# Patient Record
Sex: Female | Born: 1980 | Race: Black or African American | Hispanic: No | Marital: Single | Smoking: Former smoker
Health system: Southern US, Community
[De-identification: ages and names within clinical notes are randomized; demographics above are authoritative.]

## PROBLEM LIST (undated history)

## (undated) ENCOUNTER — Inpatient Hospital Stay (HOSPITAL_COMMUNITY): Payer: Self-pay

## (undated) DIAGNOSIS — IMO0002 Reserved for concepts with insufficient information to code with codable children: Secondary | ICD-10-CM

## (undated) DIAGNOSIS — J302 Other seasonal allergic rhinitis: Secondary | ICD-10-CM

## (undated) DIAGNOSIS — R87619 Unspecified abnormal cytological findings in specimens from cervix uteri: Secondary | ICD-10-CM

## (undated) DIAGNOSIS — I10 Essential (primary) hypertension: Secondary | ICD-10-CM

## (undated) HISTORY — PX: HERNIA REPAIR: SHX51

## (undated) HISTORY — DX: Other seasonal allergic rhinitis: J30.2

## (undated) HISTORY — PX: COLPOSCOPY: SHX161

---
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
## 2002-09-16 ENCOUNTER — Emergency Department (HOSPITAL_COMMUNITY): Admission: EM | Admit: 2002-09-16 | Discharge: 2002-09-16 | Payer: Self-pay | Admitting: Emergency Medicine

## 2002-09-19 ENCOUNTER — Emergency Department (HOSPITAL_COMMUNITY): Admission: AD | Admit: 2002-09-19 | Discharge: 2002-09-19 | Payer: Self-pay | Admitting: Emergency Medicine

## 2003-10-11 ENCOUNTER — Emergency Department (HOSPITAL_COMMUNITY): Admission: EM | Admit: 2003-10-11 | Discharge: 2003-10-11 | Payer: Self-pay | Admitting: Family Medicine

## 2006-02-25 ENCOUNTER — Encounter (INDEPENDENT_AMBULATORY_CARE_PROVIDER_SITE_OTHER): Payer: Self-pay | Admitting: Family Medicine

## 2006-06-13 ENCOUNTER — Ambulatory Visit: Payer: Self-pay | Admitting: Obstetrics & Gynecology

## 2006-08-22 ENCOUNTER — Ambulatory Visit: Payer: Self-pay | Admitting: Gynecology

## 2006-08-22 ENCOUNTER — Encounter (INDEPENDENT_AMBULATORY_CARE_PROVIDER_SITE_OTHER): Payer: Self-pay | Admitting: Gynecology

## 2006-09-08 ENCOUNTER — Emergency Department (HOSPITAL_COMMUNITY): Admission: EM | Admit: 2006-09-08 | Discharge: 2006-09-08 | Payer: Self-pay | Admitting: Emergency Medicine

## 2006-09-12 ENCOUNTER — Ambulatory Visit: Payer: Self-pay | Admitting: Obstetrics and Gynecology

## 2007-03-24 ENCOUNTER — Ambulatory Visit: Payer: Self-pay | Admitting: Family Medicine

## 2007-03-24 ENCOUNTER — Encounter (INDEPENDENT_AMBULATORY_CARE_PROVIDER_SITE_OTHER): Payer: Self-pay | Admitting: Nurse Practitioner

## 2007-03-24 LAB — CONVERTED CEMR LAB
ALT: 10 units/L (ref 0–35)
AST: 19 units/L (ref 0–37)
Albumin: 4.4 g/dL (ref 3.5–5.2)
Alkaline Phosphatase: 51 units/L (ref 39–117)
BUN: 13 mg/dL (ref 6–23)
Basophils Absolute: 0 10*3/uL (ref 0.0–0.1)
Basophils Relative: 0 % (ref 0–1)
CO2: 20 meq/L (ref 19–32)
Calcium: 9.1 mg/dL (ref 8.4–10.5)
Chloride: 109 meq/L (ref 96–112)
Creatinine, Ser: 0.74 mg/dL (ref 0.40–1.20)
Eosinophils Absolute: 0 10*3/uL (ref 0.0–0.7)
Eosinophils Relative: 1 % (ref 0–5)
Glucose, Bld: 70 mg/dL (ref 70–99)
HCT: 38.5 % (ref 36.0–46.0)
Hemoglobin: 12.7 g/dL (ref 12.0–15.0)
Lymphocytes Relative: 39 % (ref 12–46)
Lymphs Abs: 2 10*3/uL (ref 0.7–4.0)
MCHC: 33 g/dL (ref 30.0–36.0)
MCV: 93.2 fL (ref 78.0–100.0)
Monocytes Absolute: 0.4 10*3/uL (ref 0.1–1.0)
Monocytes Relative: 7 % (ref 3–12)
Neutro Abs: 2.7 10*3/uL (ref 1.7–7.7)
Neutrophils Relative %: 53 % (ref 43–77)
Platelets: 164 10*3/uL (ref 150–400)
Potassium: 4.1 meq/L (ref 3.5–5.3)
RBC: 4.13 M/uL (ref 3.87–5.11)
RDW: 13.1 % (ref 11.5–15.5)
Sodium: 142 meq/L (ref 135–145)
TSH: 0.853 microintl units/mL (ref 0.350–5.50)
Total Bilirubin: 0.5 mg/dL (ref 0.3–1.2)
Total Protein: 7.2 g/dL (ref 6.0–8.3)
WBC: 5.1 10*3/uL (ref 4.0–10.5)

## 2007-03-25 ENCOUNTER — Ambulatory Visit: Payer: Self-pay | Admitting: *Deleted

## 2007-04-10 ENCOUNTER — Ambulatory Visit: Payer: Self-pay | Admitting: Family Medicine

## 2007-06-02 ENCOUNTER — Encounter (INDEPENDENT_AMBULATORY_CARE_PROVIDER_SITE_OTHER): Payer: Self-pay | Admitting: Nurse Practitioner

## 2007-06-02 ENCOUNTER — Ambulatory Visit: Payer: Self-pay | Admitting: Family Medicine

## 2007-06-02 LAB — CONVERTED CEMR LAB
Cholesterol: 183 mg/dL (ref 0–200)
HDL: 62 mg/dL (ref >39)
LDL Cholesterol: 111 mg/dL — ABNORMAL HIGH (ref 0–99)
Total CHOL/HDL Ratio: 3
Triglycerides: 50 mg/dL (ref <150)
VLDL: 10 mg/dL (ref 0–40)

## 2007-07-15 ENCOUNTER — Ambulatory Visit: Payer: Self-pay | Admitting: Family Medicine

## 2007-08-07 ENCOUNTER — Ambulatory Visit: Payer: Self-pay | Admitting: Family Medicine

## 2007-11-05 ENCOUNTER — Ambulatory Visit: Payer: Self-pay | Admitting: Internal Medicine

## 2007-11-05 ENCOUNTER — Encounter (INDEPENDENT_AMBULATORY_CARE_PROVIDER_SITE_OTHER): Payer: Self-pay | Admitting: Family Medicine

## 2007-11-05 ENCOUNTER — Other Ambulatory Visit: Admission: RE | Admit: 2007-11-05 | Discharge: 2007-11-05 | Payer: Self-pay | Admitting: Family Medicine

## 2007-11-24 ENCOUNTER — Ambulatory Visit: Payer: Self-pay | Admitting: Internal Medicine

## 2008-02-17 ENCOUNTER — Ambulatory Visit: Payer: Self-pay | Admitting: Internal Medicine

## 2009-02-13 ENCOUNTER — Emergency Department (HOSPITAL_COMMUNITY): Admission: EM | Admit: 2009-02-13 | Discharge: 2009-02-13 | Payer: Self-pay | Admitting: Emergency Medicine

## 2009-07-04 ENCOUNTER — Emergency Department (HOSPITAL_COMMUNITY): Admission: EM | Admit: 2009-07-04 | Discharge: 2009-07-04 | Payer: Self-pay | Admitting: Family Medicine

## 2009-07-10 ENCOUNTER — Encounter: Admission: RE | Admit: 2009-07-10 | Discharge: 2009-07-10 | Payer: Self-pay | Admitting: General Surgery

## 2009-08-10 ENCOUNTER — Ambulatory Visit (HOSPITAL_COMMUNITY): Admission: RE | Admit: 2009-08-10 | Discharge: 2009-08-10 | Payer: Self-pay | Admitting: General Surgery

## 2010-02-27 NOTE — Letter (Signed)
Summary: Eligibility Information  Eligibility Information   Imported By: Abby Potash 10/17/2006 11:47:08  _____________________________________________________________________  External Attachment:    Type:   Image     Comment:   External Document

## 2010-04-15 LAB — SURGICAL PCR SCREEN
MRSA, PCR: NEGATIVE
Staphylococcus aureus: NEGATIVE

## 2010-04-15 LAB — DIFFERENTIAL
Basophils Absolute: 0 10*3/uL (ref 0.0–0.1)
Basophils Relative: 1 % (ref 0–1)
Eosinophils Absolute: 0 10*3/uL (ref 0.0–0.7)
Eosinophils Relative: 1 % (ref 0–5)
Lymphocytes Relative: 34 % (ref 12–46)
Lymphs Abs: 1.8 10*3/uL (ref 0.7–4.0)
Monocytes Absolute: 0.4 10*3/uL (ref 0.1–1.0)
Monocytes Relative: 7 % (ref 3–12)
Neutro Abs: 3.1 10*3/uL (ref 1.7–7.7)
Neutrophils Relative %: 59 % (ref 43–77)

## 2010-04-15 LAB — BASIC METABOLIC PANEL
BUN: 8 mg/dL (ref 6–23)
CO2: 25 mEq/L (ref 19–32)
Calcium: 9.2 mg/dL (ref 8.4–10.5)
Chloride: 107 mEq/L (ref 96–112)
Creatinine, Ser: 0.71 mg/dL (ref 0.4–1.2)
GFR calc Af Amer: >60 mL/min (ref >60)
GFR calc non Af Amer: >60 mL/min (ref >60)
Glucose, Bld: 84 mg/dL (ref 70–99)
Potassium: 4.5 mEq/L (ref 3.5–5.1)
Sodium: 137 mEq/L (ref 135–145)

## 2010-04-15 LAB — CBC
HCT: 37.5 % (ref 36.0–46.0)
Hemoglobin: 13 g/dL (ref 12.0–15.0)
MCH: 32.9 pg (ref 26.0–34.0)
MCHC: 34.5 g/dL (ref 30.0–36.0)
MCV: 95.3 fL (ref 78.0–100.0)
Platelets: 127 10*3/uL — ABNORMAL LOW (ref 150–400)
RBC: 3.94 MIL/uL (ref 3.87–5.11)
RDW: 12.6 % (ref 11.5–15.5)
WBC: 5.2 10*3/uL (ref 4.0–10.5)

## 2010-04-15 LAB — HCG, SERUM, QUALITATIVE: Preg, Serum: NEGATIVE

## 2010-04-16 LAB — URINALYSIS, ROUTINE W REFLEX MICROSCOPIC
Bilirubin Urine: NEGATIVE
Glucose, UA: NEGATIVE mg/dL
Ketones, ur: NEGATIVE mg/dL
Leukocytes, UA: NEGATIVE
Nitrite: NEGATIVE
Protein, ur: NEGATIVE mg/dL
Specific Gravity, Urine: 1.023 (ref 1.005–1.030)
Urobilinogen, UA: 1 mg/dL (ref 0.0–1.0)
pH: 6.5 (ref 5.0–8.0)

## 2010-04-16 LAB — URINE MICROSCOPIC-ADD ON

## 2010-04-16 LAB — GC/CHLAMYDIA PROBE AMP, GENITAL
Chlamydia, DNA Probe: NEGATIVE
Chlamydia, DNA Probe: NEGATIVE
GC Probe Amp, Genital: NEGATIVE
GC Probe Amp, Genital: NEGATIVE

## 2010-04-16 LAB — WET PREP, GENITAL
Clue Cells Wet Prep HPF POC: NONE SEEN
Clue Cells Wet Prep HPF POC: NONE SEEN
Trich, Wet Prep: NONE SEEN
Trich, Wet Prep: NONE SEEN
WBC, Wet Prep HPF POC: NONE SEEN
Yeast Wet Prep HPF POC: NONE SEEN
Yeast Wet Prep HPF POC: NONE SEEN

## 2010-04-16 LAB — POCT I-STAT, CHEM 8
BUN: 12 mg/dL (ref 6–23)
Calcium, Ion: 1.1 mmol/L — ABNORMAL LOW (ref 1.12–1.32)
Chloride: 105 mEq/L (ref 96–112)
Creatinine, Ser: 0.7 mg/dL (ref 0.4–1.2)
Glucose, Bld: 85 mg/dL (ref 70–99)
HCT: 42 % (ref 36.0–46.0)
Hemoglobin: 14.3 g/dL (ref 12.0–15.0)
Potassium: 3.6 mEq/L (ref 3.5–5.1)
Sodium: 136 mEq/L (ref 135–145)
TCO2: 24 mmol/L (ref 0–100)

## 2010-04-16 LAB — POCT PREGNANCY, URINE: Preg Test, Ur: NEGATIVE

## 2010-06-12 NOTE — Group Therapy Note (Signed)
NAME:  Vidrio, [place]                 ACCOUNT NO.:  [id number]   MEDICAL RECORD NO.:  [id number]          PATIENT TYPE:  WOC   LOCATION:  [facility]                   FACILITY:  [facility]   PHYSICIAN:  Ginger Carne, MD DATE OF BIRTH:  Oct 21, 1980   DATE OF SERVICE:                                  CLINIC NOTE   This patient is a 30 year old African American female who had an  abnormal Pap smear at [facility] dated March 2008, demonstrating  high-grade squamous intraepithelial lesion, encompassing CIS moderate  and severe dysplasia.  Subsequent to that the patient had a colposcopy  performed at [facility], it was reported to be satisfactory with  several areas possibly consistent with a nonspecific lesion.  Biopsy  results indicated squamous intraepithelial lesion, but this could not be  graded due to the partially denuded tissue and therefore a definitive  diagnosis was not made by the pathologist.  The statement was highly  concerning for high-grade squamous lesion but grading could not be  obtained.  In lieu of the patient's age, it was determined the safest  course of action at this time was to repeat a Pap smear.  If it comes  back abnormal it would be preferable to have the patient be colposcoped  here at our office to reevaluate because of her abnormality.  The  patient understands same, will return in 2 weeks after this Pap smear to  discuss said findings.           ______________________________  Ginger Carne, MD     SHB/MEDQ  D:  08/22/2006  T:  08/22/2006  Job:  [id number]

## 2010-08-28 ENCOUNTER — Other Ambulatory Visit (HOSPITAL_COMMUNITY): Payer: Self-pay | Admitting: Family Medicine

## 2010-08-28 DIAGNOSIS — R519 Headache, unspecified: Secondary | ICD-10-CM

## 2010-08-30 ENCOUNTER — Ambulatory Visit (HOSPITAL_COMMUNITY)
Admission: RE | Admit: 2010-08-30 | Discharge: 2010-08-30 | Payer: Self-pay | Source: Ambulatory Visit | Attending: Family Medicine | Admitting: Family Medicine

## 2010-09-12 ENCOUNTER — Ambulatory Visit (HOSPITAL_COMMUNITY): Payer: Self-pay

## 2010-09-17 ENCOUNTER — Other Ambulatory Visit (HOSPITAL_COMMUNITY): Payer: Self-pay | Admitting: Family Medicine

## 2010-09-17 DIAGNOSIS — R519 Headache, unspecified: Secondary | ICD-10-CM

## 2010-09-18 ENCOUNTER — Other Ambulatory Visit (HOSPITAL_COMMUNITY): Payer: Self-pay | Admitting: Family Medicine

## 2010-09-18 ENCOUNTER — Ambulatory Visit (HOSPITAL_COMMUNITY)
Admission: RE | Admit: 2010-09-18 | Discharge: 2010-09-18 | Disposition: A | Discharge status: Home / Self Care | Payer: Self-pay | Source: Ambulatory Visit | Attending: Family Medicine | Admitting: Family Medicine

## 2010-09-18 DIAGNOSIS — R519 Headache, unspecified: Secondary | ICD-10-CM

## 2010-09-18 DIAGNOSIS — R51 Headache: Secondary | ICD-10-CM | POA: Insufficient documentation

## 2011-01-29 NOTE — L&D Delivery Note (Signed)
Delivery Note At 2:24 PM a viable female was delivered via Vaginal, Spontaneous Delivery (Presentation: Middle Occiput Anterior).  APGAR: 8, 9; weight .   Placenta status: Intact, Spontaneous.  Cord: 3 vessels with the following complications: None.  Cord pH: not done  Anesthesia: Epidural  Episiotomy: None Lacerations: 2nd degree Suture Repair: 2.0 vicryl Est. Blood Loss (mL):   Mom to postpartum.  Baby to nursery-stable.  Rhonda Fisher A 12/17/2011, 2:35 PM

## 2011-04-16 ENCOUNTER — Emergency Department (HOSPITAL_COMMUNITY)
Admission: EM | Admit: 2011-04-16 | Discharge: 2011-04-17 | Disposition: A | Discharge status: Home / Self Care | Payer: Medicaid Other | Attending: Emergency Medicine | Admitting: Emergency Medicine

## 2011-04-16 ENCOUNTER — Encounter (HOSPITAL_COMMUNITY): Payer: Self-pay | Admitting: Emergency Medicine

## 2011-04-16 DIAGNOSIS — Z9889 Other specified postprocedural states: Secondary | ICD-10-CM | POA: Insufficient documentation

## 2011-04-16 DIAGNOSIS — Z331 Pregnant state, incidental: Secondary | ICD-10-CM | POA: Insufficient documentation

## 2011-04-16 DIAGNOSIS — F17200 Nicotine dependence, unspecified, uncomplicated: Secondary | ICD-10-CM | POA: Insufficient documentation

## 2011-04-16 DIAGNOSIS — R109 Unspecified abdominal pain: Secondary | ICD-10-CM | POA: Insufficient documentation

## 2011-04-16 LAB — URINE MICROSCOPIC-ADD ON

## 2011-04-16 LAB — URINALYSIS, ROUTINE W REFLEX MICROSCOPIC
Bilirubin Urine: NEGATIVE
Glucose, UA: NEGATIVE mg/dL
Hgb urine dipstick: NEGATIVE
Ketones, ur: NEGATIVE mg/dL
Nitrite: NEGATIVE
Protein, ur: NEGATIVE mg/dL
Specific Gravity, Urine: 1.023 (ref 1.005–1.030)
Urobilinogen, UA: 0.2 mg/dL (ref 0.0–1.0)
pH: 5.5 (ref 5.0–8.0)

## 2011-04-16 LAB — POCT PREGNANCY, URINE: Preg Test, Ur: POSITIVE — AB

## 2011-04-16 NOTE — ED Notes (Signed)
Pt c/o lower abd cramping off and on x's 2 weeks. Last BM 2 days ago.  Denies nausea or vomiting, No vag. discharge

## 2011-04-17 ENCOUNTER — Emergency Department (HOSPITAL_COMMUNITY): Payer: Medicaid Other

## 2011-04-17 LAB — DIFFERENTIAL
Basophils Absolute: 0 10*3/uL (ref 0.0–0.1)
Basophils Relative: 0 % (ref 0–1)
Eosinophils Absolute: 0.1 10*3/uL (ref 0.0–0.7)
Eosinophils Relative: 1 % (ref 0–5)
Lymphocytes Relative: 38 % (ref 12–46)
Lymphs Abs: 2.8 10*3/uL (ref 0.7–4.0)
Monocytes Absolute: 1 10*3/uL (ref 0.1–1.0)
Monocytes Relative: 13 % — ABNORMAL HIGH (ref 3–12)
Neutro Abs: 3.5 10*3/uL (ref 1.7–7.7)
Neutrophils Relative %: 48 % (ref 43–77)

## 2011-04-17 LAB — CBC
HCT: 36.7 % (ref 36.0–46.0)
Hemoglobin: 13 g/dL (ref 12.0–15.0)
MCH: 31.6 pg (ref 26.0–34.0)
MCHC: 35.4 g/dL (ref 30.0–36.0)
MCV: 89.1 fL (ref 78.0–100.0)
Platelets: 159 10*3/uL (ref 150–400)
RBC: 4.12 MIL/uL (ref 3.87–5.11)
RDW: 12.1 % (ref 11.5–15.5)
WBC: 7.3 10*3/uL (ref 4.0–10.5)

## 2011-04-17 LAB — HCG, QUANTITATIVE, PREGNANCY: hCG, Beta Chain, Quant, S: 6714 m[IU]/mL — ABNORMAL HIGH (ref <5)

## 2011-04-17 NOTE — ED Provider Notes (Signed)
History     CSN: [id number]  Arrival date & time 04/16/11  2011   First MD Initiated Contact with Patient 04/17/11 0056      Chief Complaint  Patient presents with  . Abdominal Pain    (Consider location/radiation/quality/duration/timing/severity/associated sxs/prior treatment) HPI Comments: Last period only lasted 2 days.  This was last month.  Patient is a 31 y.o. female presenting with abdominal pain. The history is provided by the patient.  Abdominal Pain The primary symptoms of the illness include abdominal pain. The primary symptoms of the illness do not include fever, nausea, vomiting, dysuria or vaginal discharge. Episode onset: 2 weeks. The onset of the illness was gradual. The problem has been gradually worsening.  Pregnant Now: possibly. The patient has not had a change in bowel habit. Symptoms associated with the illness do not include chills, urgency, frequency or back pain.    History reviewed. No pertinent past medical history.  Past Surgical History  Procedure Date  . Hernia repair     No family history on file.  History  Substance Use Topics  . Smoking status: Current Everyday Smoker  . Smokeless tobacco: Not on file  . Alcohol Use: Yes     ocassional    OB History    Grav Para Term Preterm Abortions TAB SAB Ect Mult Living                  Review of Systems  Constitutional: Negative for fever and chills.  Gastrointestinal: Positive for abdominal pain. Negative for nausea and vomiting.  Genitourinary: Negative for dysuria, urgency, frequency and vaginal discharge.  Musculoskeletal: Negative for back pain.  All other systems reviewed and are negative.    Allergies  Review of patient's allergies indicates no known allergies.  Home Medications   Current Outpatient Rx  Name Route Sig Dispense Refill  . IBUPROFEN 200 MG PO TABS Oral Take 400 mg by mouth every 6 (six) hours as needed. For pain      BP 135/86  Pulse 98  Temp(Src) 97.7 F  (36.5 C) (Oral)  Resp 16  SpO2 100%  LMP 03/13/2011  Physical Exam  Nursing note and vitals reviewed. Constitutional: She is oriented to person, place, and time. She appears well-developed and well-nourished. No distress.  HENT:  Head: Normocephalic and atraumatic.  Neck: Normal range of motion. Neck supple.  Cardiovascular: Normal rate and regular rhythm.  Exam reveals no gallop and no friction rub.   No murmur heard. Pulmonary/Chest: Effort normal and breath sounds normal. No respiratory distress. She has no wheezes.  Abdominal: Soft. Bowel sounds are normal. She exhibits no distension. There is no tenderness.  Musculoskeletal: Normal range of motion.  Neurological: She is alert and oriented to person, place, and time.  Skin: Skin is warm and dry. She is not diaphoretic.    ED Course  Procedures (including critical care time)  Labs Reviewed  URINALYSIS, ROUTINE W REFLEX MICROSCOPIC - Abnormal; Notable for the following:    Leukocytes, UA SMALL (*)    All other components within normal limits  POCT PREGNANCY, URINE - Abnormal; Notable for the following:    Preg Test, Ur POSITIVE (*)    All other components within normal limits  URINE MICROSCOPIC-ADD ON   No results found.   No diagnosis found.    MDM  She presents with lower abd pain, cramping for the past two days.  Her last period was short.  Pregnancy test here was positive and serum Bhcg  was 6700.  Ultrasound was performed showing an iup.  No ectopic.  Will discharge to home with follow up with OB.        Geoffery Lyons, MD 04/18/11 1606

## 2011-04-17 NOTE — Discharge Instructions (Signed)
Abdominal Pain During Pregnancy  Abdominal discomfort is common in pregnancy. Most of the time, it does not cause harm. There are many causes of abdominal pain. Some causes are more serious than others. Some of the causes of abdominal pain in pregnancy are easily diagnosed. Occasionally, the diagnosis takes time to understand. Other times, the cause is not determined. Abdominal pain can be a sign that something is very wrong with the pregnancy, or the pain may have nothing to do with the pregnancy at all. For this reason, always tell your caregiver if you have any abdominal discomfort.  CAUSES  Common and harmless causes of abdominal pain include:   Constipation.   Excess gas and bloating.   Round ligament pain. This is pain that is felt in the folds of the groin.   The position the baby or placenta is in.   Baby kicks.   Braxton-Hicks contractions. These are mild contractions that do not cause cervical dilation.  Serious causes of abdominal pain include:   Ectopic pregnancy. This happens when a fertilized egg implants outside of the uterus.   Miscarriage.   Preterm labor. This is when labor starts at less than 37 weeks of pregnancy.   Placental abruption. This is when the placenta partially or completely separates from the uterus.   Preeclampsia. This is often associated with high blood pressure and has been referred to as "toxemia in pregnancy."   Uterine or amniotic fluid infections.  Causes unrelated to pregnancy include:   Urinary tract infection.   Gallbladder stones or inflammation.   Hepatitis or other liver illness.   Intestinal problems, stomach flu, food poisoning, or ulcer.   Appendicitis.   Kidney (renal) stones.   Kidney infection (pylonephritis).  HOME CARE INSTRUCTIONS   For mild pain:   Do not have sexual intercourse or put anything in your vagina until your symptoms go away completely.   Get plenty of rest until your pain improves. If your pain does not improve in 1 hour, call  your caregiver.   Drink clear fluids if you feel nauseous. Avoid solid food as long as you are uncomfortable or nauseous.   Only take medicine as directed by your caregiver.   Keep all follow-up appointments with your caregiver.  SEEK IMMEDIATE MEDICAL CARE IF:   You are bleeding, leaking fluid, or passing tissue from the vagina.   You have increasing pain or cramping.   You have persistent vomiting.   You have painful or bloody urination.   You have a fever.   You notice a decrease in your baby's movements.   You have extreme weakness or feel faint.   You have shortness of breath, with or without abdominal pain.   You develop a severe headache with abdominal pain.   You have abnormal vaginal discharge with abdominal pain.   You have persistent diarrhea.   You have abdominal pain that continues even after rest, or gets worse.  MAKE SURE YOU:    Understand these instructions.   Will watch your condition.   Will get help right away if you are not doing well or get worse.  Document Released: 01/14/2005 Document Revised: 01/03/2011 Document Reviewed: 08/10/2010  [employer] Patient Information 2012 [employer].

## 2011-04-21 ENCOUNTER — Encounter (HOSPITAL_COMMUNITY): Payer: Self-pay | Admitting: Advanced Practice Midwife

## 2011-04-21 ENCOUNTER — Inpatient Hospital Stay (HOSPITAL_COMMUNITY)
Admission: AD | Admit: 2011-04-21 | Discharge: 2011-04-21 | Disposition: A | Discharge status: Home / Self Care | Payer: Medicaid Other | Source: Ambulatory Visit | Attending: Obstetrics & Gynecology | Admitting: Obstetrics & Gynecology

## 2011-04-21 DIAGNOSIS — Z3490 Encounter for supervision of normal pregnancy, unspecified, unspecified trimester: Secondary | ICD-10-CM

## 2011-04-21 DIAGNOSIS — R109 Unspecified abdominal pain: Secondary | ICD-10-CM | POA: Insufficient documentation

## 2011-04-21 DIAGNOSIS — Z331 Pregnant state, incidental: Secondary | ICD-10-CM

## 2011-04-21 DIAGNOSIS — R87613 High grade squamous intraepithelial lesion on cytologic smear of cervix (HGSIL): Secondary | ICD-10-CM

## 2011-04-21 DIAGNOSIS — Z3400 Encounter for supervision of normal first pregnancy, unspecified trimester: Secondary | ICD-10-CM

## 2011-04-21 DIAGNOSIS — K5900 Constipation, unspecified: Secondary | ICD-10-CM | POA: Insufficient documentation

## 2011-04-21 DIAGNOSIS — O99891 Other specified diseases and conditions complicating pregnancy: Secondary | ICD-10-CM | POA: Insufficient documentation

## 2011-04-21 DIAGNOSIS — Z9889 Other specified postprocedural states: Secondary | ICD-10-CM

## 2011-04-21 DIAGNOSIS — Z8719 Personal history of other diseases of the digestive system: Secondary | ICD-10-CM

## 2011-04-21 HISTORY — DX: Unspecified abnormal cytological findings in specimens from cervix uteri: R87.619

## 2011-04-21 HISTORY — DX: Essential (primary) hypertension: I10

## 2011-04-21 HISTORY — DX: Reserved for concepts with insufficient information to code with codable children: IMO0002

## 2011-04-21 LAB — URINE MICROSCOPIC-ADD ON

## 2011-04-21 LAB — URINALYSIS, ROUTINE W REFLEX MICROSCOPIC
Bilirubin Urine: NEGATIVE
Glucose, UA: NEGATIVE mg/dL
Hgb urine dipstick: NEGATIVE
Ketones, ur: NEGATIVE mg/dL
Nitrite: NEGATIVE
Protein, ur: NEGATIVE mg/dL
Specific Gravity, Urine: 1.025 (ref 1.005–1.030)
Urobilinogen, UA: 0.2 mg/dL (ref 0.0–1.0)
pH: 6 (ref 5.0–8.0)

## 2011-04-21 LAB — WET PREP, GENITAL
Clue Cells Wet Prep HPF POC: NONE SEEN
Trich, Wet Prep: NONE SEEN
Yeast Wet Prep HPF POC: NONE SEEN

## 2011-04-21 MED ORDER — PRENATAL RX 60-1 MG PO TABS: 1.0000 | ORAL_TABLET | Freq: Every day | ORAL | Status: DC

## 2011-04-21 NOTE — MAU Provider Note (Signed)
History     CSN: [id number]  Arrival date and time: 04/21/11 2031   First Provider Initiated Contact with Patient 04/21/11 2237      Chief Complaint  Patient presents with  . Abdominal Pain   HPI This is a 31 y.o. at [redacted]w[redacted]d who presents with persistent Cramping.  Was seen at the other ER about 4 days ago for the same.  Had a quant and [place] which showed and IUGS with yolk sac.  No bleeding. No fever. States it is her first pregnancy and she wants to be sure everything is OK.    OB History    Grav Para Term Preterm Abortions TAB SAB Ect Mult Living   1 0 0 0 0 0 0 0 0 0       Past Medical History  Diagnosis Date  . Hypertension     currently no meds  . Abnormal Pap smear     colpo wnl    Past Surgical History  Procedure Date  . Hernia repair   . Colposcopy     Family History  Problem Relation Age of Onset  . Anesthesia problems Neg Hx     History  Substance Use Topics  . Smoking status: Current Everyday Smoker -- 0.2 packs/day    Types: Cigarettes  . Smokeless tobacco: Not on file  . Alcohol Use: No     ocassional    Allergies: No Known Allergies  Prescriptions prior to admission  Medication Sig Dispense Refill  . Multiple Vitamin (MULITIVITAMIN WITH MINERALS) TABS Take 1 tablet by mouth every morning.      Marland Kitchen ibuprofen (ADVIL,MOTRIN) 200 MG tablet Take 400 mg by mouth every 6 (six) hours as needed. For pain        Review of Systems  Constitutional: Negative for fever, chills and malaise/fatigue.  Cardiovascular: Negative for chest pain.  Gastrointestinal: Positive for abdominal pain (uterine cramps) and constipation. Negative for heartburn, nausea, vomiting and diarrhea.  Genitourinary: Negative for dysuria.       No bleeding    Physical Exam   Blood pressure 120/69, pulse 93, temperature 97.4 F (36.3 C), resp. rate 16, height 4' 11.5" (1.511 m), weight 117 lb (53.071 kg), last menstrual period 03/13/2011, SpO2 100.00%.  Physical Exam    Constitutional: She is oriented to person, place, and time. She appears well-developed and well-nourished. No distress.  HENT:  Head: Normocephalic.  Cardiovascular: Normal rate.   Respiratory: Effort normal.  GI: Soft. She exhibits no distension and no mass. There is no tenderness. There is no rebound and no guarding.  Genitourinary: Vagina normal and uterus normal. No vaginal discharge found.       Pelvic done (not done at other visit) GC/Chlam and wet prep done Uterus small, nontender Adnexae nontender  Musculoskeletal: Normal range of motion.  Neurological: She is alert and oriented to person, place, and time.  Skin: Skin is warm and dry.  Psychiatric: She has a normal mood and affect.   Results for orders placed during the hospital encounter of 04/21/11 (from the past 24 hour(s))  URINALYSIS, ROUTINE W REFLEX MICROSCOPIC     Status: Abnormal   Collection Time   04/21/11  8:58 PM      Component Value Range   Color, Urine YELLOW  YELLOW    APPearance HAZY (*) CLEAR    Specific Gravity, Urine 1.025  1.005 - 1.030    pH 6.0  5.0 - 8.0    Glucose, UA NEGATIVE  NEGATIVE (mg/dL)  Hgb urine dipstick NEGATIVE  NEGATIVE    Bilirubin Urine NEGATIVE  NEGATIVE    Ketones, ur NEGATIVE  NEGATIVE (mg/dL)   Protein, ur NEGATIVE  NEGATIVE (mg/dL)   Urobilinogen, UA 0.2  0.0 - 1.0 (mg/dL)   Nitrite NEGATIVE  NEGATIVE    Leukocytes, UA TRACE (*) NEGATIVE   URINE MICROSCOPIC-ADD ON     Status: Abnormal   Collection Time   04/21/11  8:58 PM      Component Value Range   Squamous Epithelial / LPF FEW (*) RARE    WBC, UA 0-2  <3 (WBC/hpf)   RBC / HPF 0-2  <3 (RBC/hpf)   Bacteria, UA RARE  RARE    Urine-Other MUCOUS PRESENT    WET PREP, GENITAL     Status: Abnormal   Collection Time   04/21/11 10:45 PM      Component Value Range   Yeast Wet Prep HPF POC NONE SEEN  NONE SEEN    Trich, Wet Prep NONE SEEN  NONE SEEN    Clue Cells Wet Prep HPF POC NONE SEEN  NONE SEEN    WBC, Wet Prep HPF POC  MODERATE (*) NONE SEEN     MAU Course  Procedures  MDM GC/Chlamydia, Wet Prep done Informed too early to repeat US. If develops severe pain or bleeding will repeat Pregnancy Verification letter given for Medicaid application   Assessment and Plan  A:  IUP at [redacted]w[redacted]d      No bleeding      Cramping, probably due to constipation (chronic) P:  Discussed Miralax/fiber      Start PNV      Refer to prenatal care  [facility] 04/21/2011, 10:52 PM

## 2011-04-21 NOTE — MAU Provider Note (Signed)
Attestation of Attending Supervision of Advanced Practitioner: Evaluation and management procedures were performed by the PA/NP/CNM/OB Fellow under my supervision/collaboration. Chart reviewed, and agree with management and plan.  Jaynie Collins, M.D. 04/21/2011 11:35 PM

## 2011-04-21 NOTE — Discharge Instructions (Signed)
ABCs of Pregnancy A Antepartum care is very important. Be sure you see your doctor and get prenatal care as soon as you think you are pregnant. At this time, you will be tested for infection, genetic abnormalities and potential problems with you and the pregnancy. This is the time to discuss diet, exercise, work, medications, labor, pain medication during labor and the possibility of a cesarean delivery. Ask any questions that may concern you. It is important to see your doctor regularly throughout your pregnancy. Avoid exposure to toxic substances and chemicals - such as cleaning solvents, lead and mercury, some insecticides, and paint. Pregnant women should avoid exposure to paint fumes, and fumes that cause you to feel ill, dizzy or faint. When possible, it is a good idea to have a pre-pregnancy consultation with your caregiver to begin some important recommendations your caregiver suggests such as, taking folic acid, exercising, quitting smoking, avoiding alcoholic beverages, etc. B Breastfeeding is the healthiest choice for both you and your baby. It has many nutritional benefits for the baby and health benefits for the mother. It also creates a very tight and loving bond between the baby and mother. Talk to your doctor, your family and friends, and your employer about how you choose to feed your baby and how they can support you in your decision. Not all birth defects can be prevented, but a woman can take actions that may increase her chance of having a healthy baby. Many birth defects happen very early in pregnancy, sometimes before a woman even knows she is pregnant. Birth defects or abnormalities of any child in your or the father's family should be discussed with your caregiver. Get a good support bra as your breast size changes. Wear it especially when you exercise and when nursing.  C Celebrate the news of your pregnancy with the your spouse/father and family. Childbirth classes are helpful to  take for you and the spouse/father because it helps to understand what happens during the pregnancy, labor and delivery. Cesarean delivery should be discussed with your doctor so you are prepared for that possibility. The pros and cons of circumcision if it is a boy, should be discussed with your pediatrician. Cigarette smoking during pregnancy can result in low birth weight babies. It has been associated with infertility, miscarriages, tubal pregnancies, infant death (mortality) and poor health (morbidity) in childhood. Additionally, cigarette smoking may cause long-term learning disabilities. If you smoke, you should try to quit before getting pregnant and not smoke during the pregnancy. Secondary smoke may also harm a mother and her developing baby. It is a good idea to ask people to stop smoking around you during your pregnancy and after the baby is born. Extra calcium is necessary when you are pregnant and is found in your prenatal vitamin, in dairy products, green leafy vegetables and in calcium supplements. D A healthy diet according to your current weight and height, along with vitamins and mineral supplements should be discussed with your caregiver. Domestic abuse or violence should be made known to your doctor right away to get the situation corrected. Drink more water when you exercise to keep hydrated. Discomfort of your back and legs usually develops and progresses from the middle of the second trimester through to delivery of the baby. This is because of the enlarging baby and uterus, which may also affect your balance. Do not take illegal drugs. Illegal drugs can seriously harm the baby and you. Drink extra fluids (water is best) throughout pregnancy to help   your body keep up with the increases in your blood volume. Drink at least 6 to 8 glasses of water, fruit juice, or milk each day. A good way to know you are drinking enough fluid is when your urine looks almost like clear water or is very light  yellow.  E Eat healthy to get the nutrients you and your unborn baby need. Your meals should include the five basic food groups. Exercise (30 minutes of light to moderate exercise a day) is important and encouraged during pregnancy, if there are no medical problems or problems with the pregnancy. Exercise that causes discomfort or dizziness should be stopped and reported to your caregiver. Emotions during pregnancy can change from being ecstatic to depression and should be understood by you, your partner and your family. F Fetal screening with ultrasound, amniocentesis and monitoring during pregnancy and labor is common and sometimes necessary. Take 400 micrograms of folic acid daily both before, when possible, and during the first few months of pregnancy to reduce the risk of birth defects of the brain and spine. All women who could possibly become pregnant should take a vitamin with folic acid, every day. It is also important to eat a healthy diet with fortified foods (enriched grain products, including cereals, rice, breads, and pastas) and foods with natural sources of folate (orange juice, green leafy vegetables, beans, peanuts, broccoli, asparagus, peas, and lentils). The father should be involved with all aspects of the pregnancy including, the prenatal care, childbirth classes, labor, delivery, and postpartum time. Fathers may also have emotional concerns about being a father, financial needs, and raising a family. G Genetic testing should be done appropriately. It is important to know your family and the father's history. If there have been problems with pregnancies or birth defects in your family, report these to your doctor. Also, genetic counselors can talk with you about the information you might need in making decisions about having a family. You can call a major medical center in your area for help in finding a board-certified genetic counselor. Genetic testing and counseling should be done  before pregnancy when possible, especially if there is a history of problems in the mother's or father's family. Certain ethnic backgrounds are more at risk for genetic defects. H Get familiar with the hospital where you will be having your baby. Get to know how long it takes to get there, the labor and delivery area, and the hospital procedures. Be sure your medical insurance is accepted there. Get your home ready for the baby including, clothes, the baby's room (when possible), furniture and car seat. Hand washing is important throughout the day, especially after handling raw meat and poultry, changing the baby's diaper or using the bathroom. This can help prevent the spread of many bacteria and viruses that cause infection. Your hair may become dry and thinner, but will return to normal a few weeks after the baby is born. Heartburn is a common problem that can be treated by taking antacids recommended by your caregiver, eating smaller meals 5 or 6 times a day, not drinking liquids when eating, drinking between meals and raising the head of your bed 2 to 3 inches. I Insurance to cover you, the baby, doctor and hospital should be reviewed so that you will be prepared to pay any costs not covered by your insurance plan. If you do not have medical insurance, there are usually clinics and services available for you in your community. Take 30 milligrams of iron during   your pregnancy as prescribed by your doctor to reduce the risk of low red blood cells (anemia) later in pregnancy. All women of childbearing age should eat a diet rich in iron. J There should be a joint effort for the mother, father and any other children to adapt to the pregnancy financially, emotionally, and psychologically during the pregnancy. Join a support group for moms-to-be. Or, join a class on parenting or childbirth. Have the family participate when possible. K Know your limits. Let your caregiver know if you experience any of the  following:   Pain of any kind.   Strong cramps.   You develop a lot of weight in a short period of time (5 pounds in 3 to 5 days).   Vaginal bleeding, leaking of amniotic fluid.   Headache, vision problems.   Dizziness, fainting, shortness of breath.   Chest pain.   Fever of 102 F (38.9 C) or higher.   Gush of clear fluid from your vagina.   Painful urination.   Domestic violence.   Irregular heartbeat (palpitations).   Rapid beating of the heart (tachycardia).   Constant feeling sick to your stomach (nauseous) and vomiting.   Trouble walking, fluid retention (edema).   Muscle weakness.   If your baby has decreased activity.   Persistent diarrhea.   Abnormal vaginal discharge.   Uterine contractions at 20-minute intervals.   Back pain that travels down your leg.  L Learn and practice that what you eat and drink should be in moderation and healthy for you and your baby. Legal drugs such as alcohol and caffeine are important issues for pregnant women. There is no safe amount of alcohol a woman can drink while pregnant. Fetal alcohol syndrome, a disorder characterized by growth retardation, facial abnormalities, and central nervous system dysfunction, is caused by a woman's use of alcohol during pregnancy. Caffeine, found in tea, coffee, soft drinks and chocolate, should also be limited. Be sure to read labels when trying to cut down on caffeine during pregnancy. More than 200 foods, beverages, and over-the-counter medications contain caffeine and have a high salt content! There are coffees and teas that do not contain caffeine. M Medical conditions such as diabetes, epilepsy, and high blood pressure should be treated and kept under control before pregnancy when possible, but especially during pregnancy. Ask your caregiver about any medications that may need to be changed or adjusted during pregnancy. If you are currently taking any medications, ask your caregiver if it  is safe to take them while you are pregnant or before getting pregnant when possible. Also, be sure to discuss any herbs or vitamins you are taking. They are medicines, too! Discuss with your doctor all medications, prescribed and over-the-counter, that you are taking. During your prenatal visit, discuss the medications your doctor may give you during labor and delivery. N Never be afraid to ask your doctor or caregiver questions about your health, the progress of the pregnancy, family problems, stressful situations, and recommendation for a pediatrician, if you do not have one. It is better to take all precautions and discuss any questions or concerns you may have during your office visits. It is a good idea to write down your questions before you visit the doctor. O Over-the-counter cough and cold remedies may contain alcohol or other ingredients that should be avoided during pregnancy. Ask your caregiver about prescription, herbs or over-the-counter medications that you are taking or may consider taking while pregnant.  P Physical activity during pregnancy can   benefit both you and your baby by lessening discomfort and fatigue, providing a sense of well-being, and increasing the likelihood of early recovery after delivery. Light to moderate exercise during pregnancy strengthens the belly (abdominal) and back muscles. This helps improve posture. Practicing yoga, walking, swimming, and cycling on a stationary bicycle are usually safe exercises for pregnant women. Avoid scuba diving, exercise at high altitudes (over 3000 feet), skiing, horseback riding, contact sports, etc. Always check with your doctor before beginning any kind of exercise, especially during pregnancy and especially if you did not exercise before getting pregnant. Q Queasiness, stomach upset and morning sickness are common during pregnancy. Eating a couple of crackers or dry toast before getting out of bed. Foods that you normally love may  make you feel sick to your stomach. You may need to substitute other nutritious foods. Eating 5 or 6 small meals a day instead of 3 large ones may make you feel better. Do not drink with your meals, drink between meals. Questions that you have should be written down and asked during your prenatal visits. R Read about and make plans to baby-proof your home. There are important tips for making your home a safer environment for your baby. Review the tips and make your home safer for you and your baby. Read food labels regarding calories, salt and fat content in the food. S Saunas, hot tubs, and steam rooms should be avoided while you are pregnant. Excessive high heat may be harmful during your pregnancy. Your caregiver will screen and examine you for sexually transmitted diseases and genetic disorders during your prenatal visits. Learn the signs of labor. Sexual relations while pregnant is safe unless there is a medical or pregnancy problem and your caregiver advises against it. T Traveling long distances should be avoided especially in the third trimester of your pregnancy. If you do have to travel out of state, be sure to take a copy of your medical records and medical insurance plan with you. You should not travel long distances without seeing your doctor first. Most airlines will not allow you to travel after 36 weeks of pregnancy. Toxoplasmosis is an infection caused by a parasite that can seriously harm an unborn baby. Avoid eating undercooked meat and handling cat litter. Be sure to wear gloves when gardening. Tingling of the hands and fingers is not unusual and is due to fluid retention. This will go away after the baby is born. U Womb (uterus) size increases during the first trimester. Your kidneys will begin to function more efficiently. This may cause you to feel the need to urinate more often. You may also leak urine when sneezing, coughing or laughing. This is due to the growing uterus pressing  against your bladder, which lies directly in front of and slightly under the uterus during the first few months of pregnancy. If you experience burning along with frequency of urination or bloody urine, be sure to tell your doctor. The size of your uterus in the third trimester may cause a problem with your balance. It is advisable to maintain good posture and avoid wearing high heels during this time. An ultrasound of your baby may be necessary during your pregnancy and is safe for you and your baby. V Vaccinations are an important concern for pregnant women. Get needed vaccines before pregnancy. Center for Disease Control (www.cdc.gov) has clear guidelines for the use of vaccines during pregnancy. Review the list, be sure to discuss it with your doctor. Prenatal vitamins are helpful   and healthy for you and the baby. Do not take extra vitamins except what is recommended. Taking too much of certain vitamins can cause overdose problems. Continuous vomiting should be reported to your caregiver. Varicose veins may appear especially if there is a family history of varicose veins. They should subside after the delivery of the baby. Support hose helps if there is leg discomfort. W Being overweight or underweight during pregnancy may cause problems. Try to get within 15 pounds of your ideal weight before pregnancy. Remember, pregnancy is not a time to be dieting! Do not stop eating or start skipping meals as your weight increases. Both you and your baby need the calories and nutrition you receive from a healthy diet. Be sure to consult with your doctor about your diet. There is a formula and diet plan available depending on whether you are overweight or underweight. Your caregiver or nutritionist can help and advise you if necessary. X Avoid X-rays. If you must have dental work or diagnostic tests, tell your dentist or physician that you are pregnant so that extra care can be taken. X-rays should only be taken when  the risks of not taking them outweigh the risk of taking them. If needed, only the minimum amount of radiation should be used. When X-rays are necessary, protective lead shields should be used to cover areas of the body that are not being X-rayed. Y Your baby loves you. Breastfeeding your baby creates a loving and very close bond between the two of you. Give your baby a healthy environment to live in while you are pregnant. Infants and children require constant care and guidance. Their health and safety should be carefully watched at all times. After the baby is born, rest or take a nap when the baby is sleeping. Z Get your ZZZs. Be sure to get plenty of rest. Resting on your side as often as possible, especially on your left side is advised. It provides the best circulation to your baby and helps reduce swelling. Try taking a nap for 30 to 45 minutes in the afternoon when possible. After the baby is born rest or take a nap when the baby is sleeping. Try elevating your feet for that amount of time when possible. It helps the circulation in your legs and helps reduce swelling.  Most information courtesy of the CDC. Document Released: 01/14/2005 Document Revised: 01/03/2011 Document Reviewed: 09/28/2008 [employer] Patient Information 2012 [employer]. 

## 2011-04-21 NOTE — MAU Note (Signed)
Pt reports "I am having stomach pains", states she was seen at [facility] 5 days ago and was told she was pregnant. Was having the same pain then, was told she could take tylenol for pain but didn't want to take anything because she is pregnant. States the cramping has continued, denies bleeding. States it is her first baby and she just wants to make sure every thing is ok.Marland Kitchen

## 2011-04-22 LAB — GC/CHLAMYDIA PROBE AMP, GENITAL
Chlamydia, DNA Probe: NEGATIVE
GC Probe Amp, Genital: NEGATIVE

## 2011-05-09 ENCOUNTER — Inpatient Hospital Stay (HOSPITAL_COMMUNITY)
Admission: AD | Admit: 2011-05-09 | Discharge: 2011-05-09 | Disposition: A | Discharge status: Home / Self Care | Payer: Medicaid Other | Source: Ambulatory Visit | Attending: Obstetrics and Gynecology | Admitting: Obstetrics and Gynecology

## 2011-05-09 DIAGNOSIS — Z09 Encounter for follow-up examination after completed treatment for conditions other than malignant neoplasm: Secondary | ICD-10-CM | POA: Insufficient documentation

## 2011-05-09 LAB — URINALYSIS, ROUTINE W REFLEX MICROSCOPIC
Bilirubin Urine: NEGATIVE
Glucose, UA: NEGATIVE mg/dL
Hgb urine dipstick: NEGATIVE
Ketones, ur: NEGATIVE mg/dL
Leukocytes, UA: NEGATIVE
Nitrite: NEGATIVE
Protein, ur: NEGATIVE mg/dL
Specific Gravity, Urine: >1.03 — ABNORMAL HIGH (ref 1.005–1.030)
Urobilinogen, UA: 0.2 mg/dL (ref 0.0–1.0)
pH: 6 (ref 5.0–8.0)

## 2011-05-09 NOTE — MAU Note (Signed)
Pt thought she was to follow up here after being seen 2 weeks ago. Was told to return sooner with any problems or concerns. Denies pain at present, no bleeding or abnormal vaginal d/c changes.

## 2011-06-20 LAB — OB RESULTS CONSOLE HIV ANTIBODY (ROUTINE TESTING): HIV: NONREACTIVE

## 2011-06-20 LAB — OB RESULTS CONSOLE HEPATITIS B SURFACE ANTIGEN: Hepatitis B Surface Ag: NEGATIVE

## 2011-06-20 LAB — OB RESULTS CONSOLE ABO/RH: RH Type: POSITIVE

## 2011-06-20 LAB — OB RESULTS CONSOLE ANTIBODY SCREEN: Antibody Screen: NEGATIVE

## 2011-06-20 LAB — OB RESULTS CONSOLE RPR: RPR: NONREACTIVE

## 2011-06-20 LAB — OB RESULTS CONSOLE RUBELLA ANTIBODY, IGM: Rubella: IMMUNE

## 2011-11-07 LAB — OB RESULTS CONSOLE GC/CHLAMYDIA
Chlamydia: NEGATIVE
Gonorrhea: NEGATIVE

## 2011-11-07 LAB — OB RESULTS CONSOLE GBS: GBS: NEGATIVE

## 2011-12-12 ENCOUNTER — Encounter (HOSPITAL_COMMUNITY): Payer: Self-pay | Admitting: *Deleted

## 2011-12-12 ENCOUNTER — Telehealth (HOSPITAL_COMMUNITY): Payer: Self-pay | Admitting: *Deleted

## 2011-12-12 NOTE — Telephone Encounter (Signed)
Preadmission screen  

## 2011-12-15 ENCOUNTER — Encounter (HOSPITAL_COMMUNITY): Payer: Self-pay | Admitting: Family Medicine

## 2011-12-15 ENCOUNTER — Inpatient Hospital Stay (HOSPITAL_COMMUNITY)
Admission: AD | Admit: 2011-12-15 | Discharge: 2011-12-15 | Disposition: A | Discharge status: Home / Self Care | Payer: Medicaid Other | Source: Ambulatory Visit | Attending: Obstetrics | Admitting: Obstetrics

## 2011-12-15 DIAGNOSIS — O479 False labor, unspecified: Secondary | ICD-10-CM | POA: Insufficient documentation

## 2011-12-15 MED ORDER — HYDROCODONE-ACETAMINOPHEN 5-500 MG PO TABS: 1.0000 | ORAL_TABLET | ORAL | Status: DC | PRN

## 2011-12-15 MED ORDER — LACTATED RINGERS IV SOLN
INTRAVENOUS | Status: DC
  Administered 2011-12-15: 125 mL/h via INTRAVENOUS

## 2011-12-15 MED ORDER — NALBUPHINE SYRINGE 5 MG/0.5 ML
10.0000 mg | INJECTION | Freq: Once | INTRAMUSCULAR | Status: AC
  Administered 2011-12-15: 10 mg via INTRAVENOUS
  Filled 2011-12-15: qty 1

## 2011-12-15 MED ORDER — LACTATED RINGERS IV BOLUS (SEPSIS)
1000.0000 mL | Freq: Once | INTRAVENOUS | Status: AC
  Administered 2011-12-15: 1000 mL via INTRAVENOUS

## 2011-12-15 NOTE — Progress Notes (Signed)
MD notified that pt presented to MAU complaining of contractions. MD notified of FHR, UC pattern, SVE and pt pain level. Orders received to watch pt for a few hours and reassess SVE. Will continue to monitor.

## 2011-12-15 NOTE — Progress Notes (Signed)
Dr Clearance Coots notified of patient current status, including pain, tracing, ctx pattern, and patient asking for pain medicine or sleep aid. Order to discharge patient home and will ask [place] cnm to write a rx for vicodin 5/500mg  1-2tablets q4-6hours as needed for pain.

## 2011-12-17 ENCOUNTER — Encounter (HOSPITAL_COMMUNITY): Payer: Self-pay | Admitting: *Deleted

## 2011-12-17 ENCOUNTER — Inpatient Hospital Stay (HOSPITAL_COMMUNITY)
Admission: AD | Admit: 2011-12-17 | Discharge: 2011-12-19 | DRG: 775 | Disposition: A | Discharge status: Home / Self Care | Payer: Medicaid Other | Source: Ambulatory Visit | Attending: Obstetrics | Admitting: Obstetrics

## 2011-12-17 ENCOUNTER — Encounter (HOSPITAL_COMMUNITY): Payer: Self-pay | Admitting: Anesthesiology

## 2011-12-17 ENCOUNTER — Inpatient Hospital Stay (HOSPITAL_COMMUNITY): Payer: Medicaid Other | Admitting: Anesthesiology

## 2011-12-17 DIAGNOSIS — Z3400 Encounter for supervision of normal first pregnancy, unspecified trimester: Secondary | ICD-10-CM

## 2011-12-17 DIAGNOSIS — R87613 High grade squamous intraepithelial lesion on cytologic smear of cervix (HGSIL): Secondary | ICD-10-CM

## 2011-12-17 DIAGNOSIS — Z8719 Personal history of other diseases of the digestive system: Secondary | ICD-10-CM

## 2011-12-17 LAB — CBC
HCT: 36.6 % (ref 36.0–46.0)
Hemoglobin: 12.8 g/dL (ref 12.0–15.0)
MCH: 32.4 pg (ref 26.0–34.0)
MCHC: 35 g/dL (ref 30.0–36.0)
MCV: 92.7 fL (ref 78.0–100.0)
Platelets: 166 10*3/uL (ref 150–400)
RBC: 3.95 MIL/uL (ref 3.87–5.11)
RDW: 13.5 % (ref 11.5–15.5)
WBC: 12.7 10*3/uL — ABNORMAL HIGH (ref 4.0–10.5)

## 2011-12-17 LAB — RPR: RPR Ser Ql: NONREACTIVE

## 2011-12-17 MED ORDER — DIPHENHYDRAMINE HCL 50 MG/ML IJ SOLN: 12.5000 mg | INTRAMUSCULAR | Status: DC | PRN

## 2011-12-17 MED ORDER — LACTATED RINGERS IV SOLN: 500.0000 mL | Freq: Once | INTRAVENOUS | Status: DC

## 2011-12-17 MED ORDER — OXYTOCIN 40 UNITS IN LACTATED RINGERS INFUSION - SIMPLE MED
62.5000 mL/h | INTRAVENOUS | Status: DC
  Administered 2011-12-17: 62.5 mL/h via INTRAVENOUS
  Filled 2011-12-17: qty 1000

## 2011-12-17 MED ORDER — LIDOCAINE HCL (PF) 1 % IJ SOLN
30.0000 mL | INTRAMUSCULAR | Status: DC | PRN
  Filled 2011-12-17: qty 30

## 2011-12-17 MED ORDER — ONDANSETRON HCL 4 MG PO TABS: 4.0000 mg | ORAL_TABLET | ORAL | Status: DC | PRN

## 2011-12-17 MED ORDER — WITCH HAZEL-GLYCERIN EX PADS: 1.0000 "application " | MEDICATED_PAD | CUTANEOUS | Status: DC | PRN

## 2011-12-17 MED ORDER — BUTORPHANOL TARTRATE 1 MG/ML IJ SOLN: 1.0000 mg | INTRAMUSCULAR | Status: DC | PRN

## 2011-12-17 MED ORDER — ZOLPIDEM TARTRATE 5 MG PO TABS: 5.0000 mg | ORAL_TABLET | Freq: Every evening | ORAL | Status: DC | PRN

## 2011-12-17 MED ORDER — FLEET ENEMA 7-19 GM/118ML RE ENEM: 1.0000 | ENEMA | Freq: Once | RECTAL | Status: DC

## 2011-12-17 MED ORDER — ACETAMINOPHEN 325 MG PO TABS: 650.0000 mg | ORAL_TABLET | ORAL | Status: DC | PRN

## 2011-12-17 MED ORDER — LACTATED RINGERS IV SOLN: 500.0000 mL | INTRAVENOUS | Status: DC | PRN

## 2011-12-17 MED ORDER — IBUPROFEN 600 MG PO TABS
600.0000 mg | ORAL_TABLET | Freq: Four times a day (QID) | ORAL | Status: DC
  Administered 2011-12-17 – 2011-12-19 (×8): 600 mg via ORAL
  Filled 2011-12-17 (×6): qty 1

## 2011-12-17 MED ORDER — BENZOCAINE-MENTHOL 20-0.5 % EX AERO
1.0000 "application " | INHALATION_SPRAY | CUTANEOUS | Status: DC | PRN
  Administered 2011-12-17: 1 via TOPICAL
  Filled 2011-12-17 (×2): qty 56

## 2011-12-17 MED ORDER — OXYTOCIN BOLUS FROM INFUSION
500.0000 mL | INTRAVENOUS | Status: DC
  Administered 2011-12-17: 500 mL via INTRAVENOUS

## 2011-12-17 MED ORDER — SENNOSIDES-DOCUSATE SODIUM 8.6-50 MG PO TABS
2.0000 | ORAL_TABLET | Freq: Every day | ORAL | Status: DC
  Administered 2011-12-17 – 2011-12-18 (×2): 2 via ORAL

## 2011-12-17 MED ORDER — ONDANSETRON HCL 4 MG/2ML IJ SOLN: 4.0000 mg | INTRAMUSCULAR | Status: DC | PRN

## 2011-12-17 MED ORDER — OXYTOCIN 40 UNITS IN LACTATED RINGERS INFUSION - SIMPLE MED
1.0000 m[IU]/min | INTRAVENOUS | Status: DC
  Administered 2011-12-17: 2 m[IU]/min via INTRAVENOUS

## 2011-12-17 MED ORDER — EPHEDRINE 5 MG/ML INJ
10.0000 mg | INTRAVENOUS | Status: DC | PRN
  Filled 2011-12-17: qty 4

## 2011-12-17 MED ORDER — DIBUCAINE 1 % RE OINT: 1.0000 "application " | TOPICAL_OINTMENT | RECTAL | Status: DC | PRN

## 2011-12-17 MED ORDER — PHENYLEPHRINE 40 MCG/ML (10ML) SYRINGE FOR IV PUSH (FOR BLOOD PRESSURE SUPPORT)
80.0000 ug | PREFILLED_SYRINGE | INTRAVENOUS | Status: DC | PRN
  Filled 2011-12-17: qty 5

## 2011-12-17 MED ORDER — OXYCODONE-ACETAMINOPHEN 5-325 MG PO TABS
1.0000 | ORAL_TABLET | ORAL | Status: DC | PRN
  Administered 2011-12-18: 1 via ORAL
  Filled 2011-12-17: qty 1

## 2011-12-17 MED ORDER — ONDANSETRON HCL 4 MG/2ML IJ SOLN: 4.0000 mg | Freq: Four times a day (QID) | INTRAMUSCULAR | Status: DC | PRN

## 2011-12-17 MED ORDER — TERBUTALINE SULFATE 1 MG/ML IJ SOLN: 0.2500 mg | Freq: Once | INTRAMUSCULAR | Status: DC | PRN

## 2011-12-17 MED ORDER — CITRIC ACID-SODIUM CITRATE 334-500 MG/5ML PO SOLN: 30.0000 mL | ORAL | Status: DC | PRN

## 2011-12-17 MED ORDER — IBUPROFEN 600 MG PO TABS
600.0000 mg | ORAL_TABLET | Freq: Four times a day (QID) | ORAL | Status: DC | PRN
  Filled 2011-12-17: qty 1

## 2011-12-17 MED ORDER — PHENYLEPHRINE 40 MCG/ML (10ML) SYRINGE FOR IV PUSH (FOR BLOOD PRESSURE SUPPORT): 80.0000 ug | PREFILLED_SYRINGE | INTRAVENOUS | Status: DC | PRN

## 2011-12-17 MED ORDER — LACTATED RINGERS IV SOLN
INTRAVENOUS | Status: DC
  Administered 2011-12-17 (×3): via INTRAVENOUS

## 2011-12-17 MED ORDER — SIMETHICONE 80 MG PO CHEW: 80.0000 mg | CHEWABLE_TABLET | ORAL | Status: DC | PRN

## 2011-12-17 MED ORDER — PRENATAL MULTIVITAMIN CH
1.0000 | ORAL_TABLET | Freq: Every day | ORAL | Status: DC
  Administered 2011-12-17 – 2011-12-19 (×3): 1 via ORAL
  Filled 2011-12-17 (×3): qty 1

## 2011-12-17 MED ORDER — LANOLIN HYDROUS EX OINT: TOPICAL_OINTMENT | CUTANEOUS | Status: DC | PRN

## 2011-12-17 MED ORDER — EPHEDRINE 5 MG/ML INJ: 10.0000 mg | INTRAVENOUS | Status: DC | PRN

## 2011-12-17 MED ORDER — DIPHENHYDRAMINE HCL 25 MG PO CAPS: 25.0000 mg | ORAL_CAPSULE | Freq: Four times a day (QID) | ORAL | Status: DC | PRN

## 2011-12-17 MED ORDER — TETANUS-DIPHTH-ACELL PERTUSSIS 5-2.5-18.5 LF-MCG/0.5 IM SUSP
0.5000 mL | Freq: Once | INTRAMUSCULAR | Status: AC
  Administered 2011-12-18: 0.5 mL via INTRAMUSCULAR

## 2011-12-17 MED ORDER — FENTANYL 2.5 MCG/ML BUPIVACAINE 1/10 % EPIDURAL INFUSION (WH - ANES)
14.0000 mL/h | INTRAMUSCULAR | Status: DC
  Administered 2011-12-17: 14 mL/h via EPIDURAL
  Administered 2011-12-17: 12 mL/h via EPIDURAL
  Filled 2011-12-17 (×2): qty 125

## 2011-12-17 MED ORDER — OXYCODONE-ACETAMINOPHEN 5-325 MG PO TABS: 1.0000 | ORAL_TABLET | ORAL | Status: DC | PRN

## 2011-12-17 MED ORDER — LIDOCAINE HCL (PF) 1 % IJ SOLN
INTRAMUSCULAR | Status: DC | PRN
  Administered 2011-12-17 (×3): 4 mL

## 2011-12-17 MED ORDER — FERROUS SULFATE 325 (65 FE) MG PO TABS
325.0000 mg | ORAL_TABLET | Freq: Two times a day (BID) | ORAL | Status: DC
  Administered 2011-12-17 – 2011-12-19 (×4): 325 mg via ORAL
  Filled 2011-12-17 (×4): qty 1

## 2011-12-17 NOTE — Progress Notes (Signed)
Called Dr. Gaynell Face and updated on SVE.. MD aware. Orders to call FP on call when needed, he is in the OR

## 2011-12-17 NOTE — Anesthesia Postprocedure Evaluation (Signed)
  Anesthesia Post-op Note  Patient: Rhonda Fisher  Procedure(s) Performed: * No procedures listed *  Patient Location: Mother/Baby  Anesthesia Type:Epidural  Level of Consciousness: awake, alert  and oriented  Airway and Oxygen Therapy: Patient Spontanous Breathing  Post-op Pain: none  Post-op Assessment: Post-op Vital signs reviewed and Patient's Cardiovascular Status Stable  Post-op Vital Signs: Reviewed and stable  Complications: No apparent anesthesia complications

## 2011-12-17 NOTE — Anesthesia Procedure Notes (Signed)
Epidural Patient location during procedure: OB Start time: 12/17/2011 4:36 AM  Staffing Performed by: anesthesiologist   Preanesthetic Checklist Completed: patient identified, site marked, surgical consent, pre-op evaluation, timeout performed, IV checked, risks and benefits discussed and monitors and equipment checked  Epidural Patient position: sitting Prep: site prepped and draped and DuraPrep Patient monitoring: continuous pulse ox and blood pressure Approach: midline Injection technique: LOR air  Needle:  Needle type: Tuohy  Needle gauge: 17 G Needle length: 9 cm and 9 Needle insertion depth: 5 cm cm Catheter type: closed end flexible Catheter size: 19 Gauge Catheter at skin depth: 10 cm Test dose: negative  Assessment Events: blood not aspirated, injection not painful, no injection resistance, negative IV test and no paresthesia  Additional Notes Discussed risk of headache, infection, bleeding, nerve injury and failed or incomplete block.  Patient voices understanding and wishes to proceed. Reason for block:procedure for pain

## 2011-12-17 NOTE — Anesthesia Preprocedure Evaluation (Signed)
Anesthesia Evaluation  Patient identified by MRN, date of birth, ID band Patient awake    Reviewed: Allergy & Precautions, H&P , NPO status , Patient's Chart, lab work & pertinent test results, reviewed documented beta blocker date and time   History of Anesthesia Complications Negative for: history of anesthetic complications  Airway Mallampati: III TM Distance: >3 FB Neck ROM: full    Dental  (+) Teeth Intact   Pulmonary neg pulmonary ROS,  breath sounds clear to auscultation        Cardiovascular hypertension, Rhythm:regular Rate:Normal     Neuro/Psych negative neurological ROS  negative psych ROS   GI/Hepatic negative GI ROS, Neg liver ROS,   Endo/Other  negative endocrine ROS  Renal/GU negative Renal ROS     Musculoskeletal   Abdominal   Peds  Hematology negative hematology ROS (+)   Anesthesia Other Findings   Reproductive/Obstetrics (+) Pregnancy                           Anesthesia Physical Anesthesia Plan  ASA: II  Anesthesia Plan: Epidural   Post-op Pain Management:    Induction:   Airway Management Planned:   Additional Equipment:   Intra-op Plan:   Post-operative Plan:   Informed Consent: I have reviewed the patients History and Physical, chart, labs and discussed the procedure including the risks, benefits and alternatives for the proposed anesthesia with the patient or authorized representative who has indicated his/her understanding and acceptance.     Plan Discussed with:   Anesthesia Plan Comments:         Anesthesia Quick Evaluation  

## 2011-12-17 NOTE — H&P (Signed)
This is Dr. Francoise Ceo dictating the history and physical on blank blank she's a 31 year old gravida 1 due 12/12/2011 she's 40 weeks and 5 days then admitted in labor membranes ruptured spontaneously fluid clear she 7 cm 100% with the vertex at -1 station negative GBS  An in spontaneous labor Past medical history negative Past surgical history negative Social history negatinegative System review noncontributory Physical exam well-developed female in labor HEENT negative Breasts negative Heart regular rhythm no murmurs no gallops Lungs clear to P&A Abdomen term Pelvic deferred Extremities negative and

## 2011-12-18 ENCOUNTER — Inpatient Hospital Stay (HOSPITAL_COMMUNITY): Admission: RE | Admit: 2011-12-18 | Payer: Medicaid Other | Source: Ambulatory Visit

## 2011-12-18 LAB — CBC
HCT: 34.8 % — ABNORMAL LOW (ref 36.0–46.0)
Hemoglobin: 11.9 g/dL — ABNORMAL LOW (ref 12.0–15.0)
MCH: 32.4 pg (ref 26.0–34.0)
MCHC: 34.2 g/dL (ref 30.0–36.0)
MCV: 94.8 fL (ref 78.0–100.0)
Platelets: 139 10*3/uL — ABNORMAL LOW (ref 150–400)
RBC: 3.67 MIL/uL — ABNORMAL LOW (ref 3.87–5.11)
RDW: 13.9 % (ref 11.5–15.5)
WBC: 11.4 10*3/uL — ABNORMAL HIGH (ref 4.0–10.5)

## 2011-12-18 NOTE — Progress Notes (Signed)
Patient ID: Rhonda Fisher, female   DOB: 01/08/1981, 31 y.o.   MRN: [id number] Postpartum day one Vital signs normal Fundus firm Legs negative Doing well

## 2011-12-18 NOTE — Progress Notes (Signed)
UR chart review completed.  

## 2011-12-19 NOTE — Discharge Summary (Signed)
Obstetric Discharge Summary Reason for Admission: onset of labor Prenatal Procedures: none Intrapartum Procedures: spontaneous vaginal delivery Postpartum Procedures: none Complications-Operative and Postpartum: none Hemoglobin  Date Value Range Status  12/18/2011 11.9* 12.0 - 15.0 g/dL Final     HCT  Date Value Range Status  12/18/2011 34.8* 36.0 - 46.0 % Final    Physical Exam:  General: alert Lochia: appropriate Uterine Fundus: firm Incision: healing well DVT Evaluation: No evidence of DVT seen on physical exam.  Discharge Diagnoses: Term Pregnancy-delivered  Discharge Information: Date: 12/19/2011 Activity: pelvic rest Diet: routine Medications: Percocet Condition: stable Instructions: refer to practice specific booklet Discharge to: home Follow-up Information    Call in 6 weeks to follow up.   Contact information:   b [address]all         Newborn Data: Live born female  Birth Weight: 5 lb 10 oz (2551 g) APGAR: 8, 9  Home with mother.  MARSHALL,BERNARD A 12/19/2011, 6:29 AM

## 2011-12-25 ENCOUNTER — Ambulatory Visit (HOSPITAL_COMMUNITY): Admission: RE | Admit: 2011-12-25 | Payer: Medicaid Other | Source: Ambulatory Visit

## 2012-06-09 ENCOUNTER — Other Ambulatory Visit: Payer: Self-pay | Admitting: Podiatry

## 2012-12-22 LAB — OB RESULTS CONSOLE ABO/RH: RH Type: POSITIVE

## 2012-12-22 LAB — OB RESULTS CONSOLE GC/CHLAMYDIA
Chlamydia: NEGATIVE
Gonorrhea: NEGATIVE

## 2012-12-22 LAB — OB RESULTS CONSOLE RUBELLA ANTIBODY, IGM: Rubella: IMMUNE

## 2012-12-22 LAB — OB RESULTS CONSOLE HIV ANTIBODY (ROUTINE TESTING): HIV: NONREACTIVE

## 2012-12-22 LAB — OB RESULTS CONSOLE RPR: RPR: NONREACTIVE

## 2012-12-22 LAB — OB RESULTS CONSOLE HEPATITIS B SURFACE ANTIGEN: Hepatitis B Surface Ag: NEGATIVE

## 2012-12-22 LAB — OB RESULTS CONSOLE ANTIBODY SCREEN: Antibody Screen: NEGATIVE

## 2013-01-28 NOTE — L&D Delivery Note (Signed)
Delivery Note At 11:09 AM a viable female was delivered via  (Presentation: ;  ).  APGAR: , ; weight .   Placenta status: , .  Cord:  with the following complications: .  Cord pH: not done  Anesthesia: None  Episiotomy:  Lacerations:  Suture Repair: 2.0 Est. Blood Loss (mL):   Mom to postpartum.  Baby to Couplet care / Skin to Skin.  MARSHALL,BERNARD A 07/12/2013, 11:23 AM

## 2013-02-16 ENCOUNTER — Other Ambulatory Visit (HOSPITAL_COMMUNITY): Payer: Self-pay | Admitting: Obstetrics

## 2013-02-16 DIAGNOSIS — Z3480 Encounter for supervision of other normal pregnancy, unspecified trimester: Secondary | ICD-10-CM

## 2013-02-18 ENCOUNTER — Encounter (HOSPITAL_COMMUNITY): Payer: Self-pay

## 2013-02-18 ENCOUNTER — Ambulatory Visit (HOSPITAL_COMMUNITY)
Admission: RE | Admit: 2013-02-18 | Discharge: 2013-02-18 | Disposition: A | Discharge status: Home / Self Care | Payer: Medicaid Other | Source: Ambulatory Visit | Attending: Obstetrics | Admitting: Obstetrics

## 2013-02-18 DIAGNOSIS — Z1389 Encounter for screening for other disorder: Secondary | ICD-10-CM | POA: Insufficient documentation

## 2013-02-18 DIAGNOSIS — O358XX0 Maternal care for other (suspected) fetal abnormality and damage, not applicable or unspecified: Secondary | ICD-10-CM | POA: Insufficient documentation

## 2013-02-18 DIAGNOSIS — Z363 Encounter for antenatal screening for malformations: Secondary | ICD-10-CM | POA: Insufficient documentation

## 2013-02-18 DIAGNOSIS — Z3480 Encounter for supervision of other normal pregnancy, unspecified trimester: Secondary | ICD-10-CM

## 2013-02-22 ENCOUNTER — Other Ambulatory Visit (HOSPITAL_COMMUNITY): Payer: Self-pay | Admitting: Obstetrics

## 2013-02-22 DIAGNOSIS — Z0489 Encounter for examination and observation for other specified reasons: Secondary | ICD-10-CM

## 2013-02-22 DIAGNOSIS — IMO0002 Reserved for concepts with insufficient information to code with codable children: Secondary | ICD-10-CM

## 2013-04-01 ENCOUNTER — Ambulatory Visit (HOSPITAL_COMMUNITY)
Admission: RE | Admit: 2013-04-01 | Discharge: 2013-04-01 | Disposition: A | Discharge status: Home / Self Care | Payer: Medicaid Other | Source: Ambulatory Visit | Attending: Obstetrics | Admitting: Obstetrics

## 2013-04-01 DIAGNOSIS — Z3689 Encounter for other specified antenatal screening: Secondary | ICD-10-CM | POA: Insufficient documentation

## 2013-04-01 DIAGNOSIS — Z0489 Encounter for examination and observation for other specified reasons: Secondary | ICD-10-CM

## 2013-04-01 DIAGNOSIS — IMO0002 Reserved for concepts with insufficient information to code with codable children: Secondary | ICD-10-CM

## 2013-05-31 LAB — OB RESULTS CONSOLE GC/CHLAMYDIA
Chlamydia: NEGATIVE
Gonorrhea: NEGATIVE

## 2013-07-12 ENCOUNTER — Encounter (HOSPITAL_COMMUNITY): Payer: Self-pay | Admitting: *Deleted

## 2013-07-12 ENCOUNTER — Inpatient Hospital Stay (HOSPITAL_COMMUNITY)
Admission: AD | Admit: 2013-07-12 | Discharge: 2013-07-14 | DRG: 775 | Disposition: A | Discharge status: Home / Self Care | Payer: Medicaid Other | Source: Ambulatory Visit | Attending: Obstetrics | Admitting: Obstetrics

## 2013-07-12 DIAGNOSIS — IMO0001 Reserved for inherently not codable concepts without codable children: Secondary | ICD-10-CM

## 2013-07-12 LAB — RPR

## 2013-07-12 LAB — CBC
HCT: 39.3 % (ref 36.0–46.0)
Hemoglobin: 14.1 g/dL (ref 12.0–15.0)
MCH: 32.5 pg (ref 26.0–34.0)
MCHC: 35.9 g/dL (ref 30.0–36.0)
MCV: 90.6 fL (ref 78.0–100.0)
Platelets: 144 10*3/uL — ABNORMAL LOW (ref 150–400)
RBC: 4.34 MIL/uL (ref 3.87–5.11)
RDW: 13.2 % (ref 11.5–15.5)
WBC: 8 10*3/uL (ref 4.0–10.5)

## 2013-07-12 MED ORDER — PRENATAL MULTIVITAMIN CH
1.0000 | ORAL_TABLET | Freq: Every day | ORAL | Status: DC
  Administered 2013-07-13: 1 via ORAL
  Filled 2013-07-12: qty 1

## 2013-07-12 MED ORDER — ONDANSETRON HCL 4 MG/2ML IJ SOLN
4.0000 mg | Freq: Four times a day (QID) | INTRAMUSCULAR | Status: DC | PRN
Start: 2013-07-12 — End: 2013-07-12

## 2013-07-12 MED ORDER — WITCH HAZEL-GLYCERIN EX PADS: 1.0000 "application " | MEDICATED_PAD | CUTANEOUS | Status: DC | PRN

## 2013-07-12 MED ORDER — LIDOCAINE HCL (PF) 1 % IJ SOLN
30.0000 mL | INTRAMUSCULAR | Status: DC | PRN
  Administered 2013-07-12: 30 mL via SUBCUTANEOUS
  Filled 2013-07-12: qty 30

## 2013-07-12 MED ORDER — OXYCODONE-ACETAMINOPHEN 5-325 MG PO TABS
1.0000 | ORAL_TABLET | ORAL | Status: DC | PRN
Start: 2013-07-12 — End: 2013-07-14

## 2013-07-12 MED ORDER — FLEET ENEMA 7-19 GM/118ML RE ENEM: 1.0000 | ENEMA | RECTAL | Status: DC | PRN

## 2013-07-12 MED ORDER — BUTORPHANOL TARTRATE 1 MG/ML IJ SOLN
1.0000 mg | INTRAMUSCULAR | Status: DC | PRN
  Administered 2013-07-12: 1 mg via INTRAVENOUS
  Filled 2013-07-12: qty 1

## 2013-07-12 MED ORDER — SENNOSIDES-DOCUSATE SODIUM 8.6-50 MG PO TABS
2.0000 | ORAL_TABLET | ORAL | Status: DC
  Administered 2013-07-13 (×2): 2 via ORAL
  Filled 2013-07-12 (×2): qty 2

## 2013-07-12 MED ORDER — ZOLPIDEM TARTRATE 5 MG PO TABS: 5.0000 mg | ORAL_TABLET | Freq: Every evening | ORAL | Status: DC | PRN

## 2013-07-12 MED ORDER — IBUPROFEN 600 MG PO TABS
600.0000 mg | ORAL_TABLET | Freq: Four times a day (QID) | ORAL | Status: DC
  Administered 2013-07-12 – 2013-07-13 (×6): 600 mg via ORAL
  Filled 2013-07-12 (×7): qty 1

## 2013-07-12 MED ORDER — CITRIC ACID-SODIUM CITRATE 334-500 MG/5ML PO SOLN: 30.0000 mL | ORAL | Status: DC | PRN

## 2013-07-12 MED ORDER — SIMETHICONE 80 MG PO CHEW: 80.0000 mg | CHEWABLE_TABLET | ORAL | Status: DC | PRN

## 2013-07-12 MED ORDER — FERROUS SULFATE 325 (65 FE) MG PO TABS
325.0000 mg | ORAL_TABLET | Freq: Two times a day (BID) | ORAL | Status: DC
  Administered 2013-07-12 – 2013-07-14 (×4): 325 mg via ORAL
  Filled 2013-07-12 (×4): qty 1

## 2013-07-12 MED ORDER — LACTATED RINGERS IV SOLN: INTRAVENOUS | Status: DC

## 2013-07-12 MED ORDER — TETANUS-DIPHTH-ACELL PERTUSSIS 5-2.5-18.5 LF-MCG/0.5 IM SUSP: 0.5000 mL | Freq: Once | INTRAMUSCULAR | Status: DC

## 2013-07-12 MED ORDER — ACETAMINOPHEN 325 MG PO TABS: 650.0000 mg | ORAL_TABLET | ORAL | Status: DC | PRN

## 2013-07-12 MED ORDER — OXYTOCIN BOLUS FROM INFUSION: 500.0000 mL | INTRAVENOUS | Status: DC

## 2013-07-12 MED ORDER — DIPHENHYDRAMINE HCL 25 MG PO CAPS: 25.0000 mg | ORAL_CAPSULE | Freq: Four times a day (QID) | ORAL | Status: DC | PRN

## 2013-07-12 MED ORDER — ONDANSETRON HCL 4 MG/2ML IJ SOLN: 4.0000 mg | INTRAMUSCULAR | Status: DC | PRN

## 2013-07-12 MED ORDER — LANOLIN HYDROUS EX OINT: TOPICAL_OINTMENT | CUTANEOUS | Status: DC | PRN

## 2013-07-12 MED ORDER — OXYTOCIN 40 UNITS IN LACTATED RINGERS INFUSION - SIMPLE MED
62.5000 mL/h | INTRAVENOUS | Status: DC
  Filled 2013-07-12: qty 1000

## 2013-07-12 MED ORDER — LACTATED RINGERS IV SOLN: 500.0000 mL | INTRAVENOUS | Status: DC | PRN

## 2013-07-12 MED ORDER — IBUPROFEN 600 MG PO TABS
600.0000 mg | ORAL_TABLET | Freq: Four times a day (QID) | ORAL | Status: DC | PRN
  Administered 2013-07-12 – 2013-07-14 (×2): 600 mg via ORAL
  Filled 2013-07-12: qty 1

## 2013-07-12 MED ORDER — BENZOCAINE-MENTHOL 20-0.5 % EX AERO
1.0000 "application " | INHALATION_SPRAY | CUTANEOUS | Status: DC | PRN
  Filled 2013-07-12 (×3): qty 56

## 2013-07-12 MED ORDER — DIBUCAINE 1 % RE OINT: 1.0000 "application " | TOPICAL_OINTMENT | RECTAL | Status: DC | PRN

## 2013-07-12 MED ORDER — OXYCODONE-ACETAMINOPHEN 5-325 MG PO TABS: 1.0000 | ORAL_TABLET | ORAL | Status: DC | PRN

## 2013-07-12 MED ORDER — ONDANSETRON HCL 4 MG PO TABS: 4.0000 mg | ORAL_TABLET | ORAL | Status: DC | PRN

## 2013-07-12 NOTE — Progress Notes (Signed)
UR completed 

## 2013-07-12 NOTE — MAU Note (Signed)
Contracting, no leaking or bleeding. Mucous plug around 0630 this morning. Was 1cm last wk.

## 2013-07-12 NOTE — Lactation Note (Signed)
This note was copied from the chart of Rhonda Fisher. Lactation Consultation Note  Patient Name: Rhonda [place] Today's Date: 07/12/2013 Reason for consult: Initial assessment of this experienced second-time breastfeeding mother and her newborn at 5411 hours postpartum.  Mom successfully breastfed her first child for 15 months.  The newborn has breastfed 3 times since birth with LATCH scores between 8-10 and 15-20 minute feedings.  Mom says she recently attempted but baby was too sleepy and LC discussed the normal newborn sleepiness in first 24 hours of life. LC encouraged frequent STS and cue feedings. LC encouraged review of Baby and Me pp 9, 14 and 20-25 for STS and BF information. LC provided [employer] [facility] brochure and reviewed [facility] and list of community and web site resources.     Maternal Data Formula Feeding for Exclusion: No Infant to breast within first hour of birth: Yes Has patient been taught Hand Expression?: Yes (mom states she knows how to hand express her colostrum) Does the patient have breastfeeding experience prior to this delivery?: Yes  Feeding    LATCH Score/Interventions           initial LATCH score=10 and subsequent LATCH scores=8/9           Lactation Tools Discussed/Used   STS, cue feedings, hand expression  Consult Status Consult Status: Follow-up Date: 07/13/13 Follow-up type: In-patient    Warrick ParisianBryant, Marquisha Nikolov [facility] 07/12/2013, 10:23 PM

## 2013-07-12 NOTE — H&P (Signed)
This is Dr. Francoise CeoBernard Fayth Trefry dictating the history and physical on  Rhonda Fisher she's a 33 year old gravida 2 para 100 140 weeks and 6 days EDC 07/06/2013 negative GBS admitted if centimeters dilated amniotomy performed the fluids clear progressed rapidly a normal vaginal delivery of a female Apgar 4389 from the LOA position with a nuchal cord loose x1 and a second-degree perineal repair 2-0 Vicryl placenta was spontaneous intact Past medical history negative Past surgical history negative Social history negative System review negative For 6 physical exam well-developed female in labor HEENT negative Lungs clear to P&A Heart regular rhythm no murmurs no gallops Breasts negative Abdomen uterus 20 week postpartum incised Para pelvic deferred Extremities negative

## 2013-07-13 LAB — CBC
HCT: 33.7 % — ABNORMAL LOW (ref 36.0–46.0)
Hemoglobin: 11.8 g/dL — ABNORMAL LOW (ref 12.0–15.0)
MCH: 32.5 pg (ref 26.0–34.0)
MCHC: 35 g/dL (ref 30.0–36.0)
MCV: 92.8 fL (ref 78.0–100.0)
Platelets: 128 10*3/uL — ABNORMAL LOW (ref 150–400)
RBC: 3.63 MIL/uL — ABNORMAL LOW (ref 3.87–5.11)
RDW: 13.6 % (ref 11.5–15.5)
WBC: 11.7 10*3/uL — ABNORMAL HIGH (ref 4.0–10.5)

## 2013-07-13 NOTE — Progress Notes (Signed)
Patient ID: Rhonda Fisher, female   DOB: 04/04/1980, 33 y.o.   MRN: [id number] Postpartum day one Vital signs normal Fundus firm Lochia moderate Doing well

## 2013-07-13 NOTE — Lactation Note (Signed)
This note was copied from the chart of Rhonda [place] Kahre. Lactation Consultation Note Follow up visit at 30 hours of age.  Baby is already latched in side lying position with wide mouth and flanged lips, encouraged mom to roll baby closer so both cheeks touch her breast and allow baby to get a deeper latch.  Swallows heard with good rhythm and strong sucking.  Mom has questions about making the transition to breastfeed while working and pumping, questions answered.  Mom denies pain at this time.  Discussed increased ouput for baby.  He has only had one void in 30 hours of life with several stools.  Mom to call for assist as needed.  Patient Name: Rhonda Fisher [id number]'VToday's Date: 07/13/2013 Reason for consult: Follow-up assessment   Maternal Data Has patient been taught Hand Expression?: Yes  Feeding Feeding Type: Breast Fed Length of feed:  (observed 15 minutes)  LATCH Score/Interventions Latch: Grasps breast easily, tongue down, lips flanged, rhythmical sucking.  Audible Swallowing: A few with stimulation  Type of Nipple: Everted at rest and after stimulation  Comfort (Breast/Nipple): Soft / non-tender     Hold (Positioning): No assistance needed to correctly position infant at breast. Intervention(s): Skin to skin;Position options;Support Pillows;Breastfeeding basics reviewed  LATCH Score: 9  Lactation Tools Discussed/Used     Consult Status Consult Status: Follow-up Date: 07/14/13 Follow-up type: In-patient    Jannifer RodneyShoptaw, Rhonda Lynn 07/13/2013, 5:23 PM

## 2013-07-14 NOTE — Progress Notes (Signed)
Patient ID: Rhonda Fisher, female   DOB: 10/15/1980, 33 y.o.   MRN: [id number] Postpartum day 2 Vital signs normal Fundus firm Lochia moderate Legs negative doing well home today

## 2013-07-14 NOTE — Discharge Instructions (Signed)
Discharge instructions   You can wash your hair  Shower  Eat what you want  Drink what you want  See me in 6 weeks  Your ankles are going to swell more in the next 2 weeks than when pregnant  No sex for 6 weeks   Mykai Wendorf A, MD 07/14/2013

## 2013-07-14 NOTE — Lactation Note (Signed)
This note was copied from the chart of Rhonda Fisher. Lactation Consultation Note   Follow up consult:  Mother placed infant in cradle hold with boppy. Baby latched easily.  Sucks and swallows observed, some with stimulation. Reviewed how to achieve a deeper latch.  Mother denies soreness or problems. Discussed strategies regarding pumping and going back to work. Provided mother with a hand pump. Reviewed cluster feeding, monitoring voids/stools, pacifier use,engorgement care. Encoaurged mother to come to support group and call if she has further questions.  Patient Name: Rhonda [place] Torbeck ZOXWR'UToday's Date: 07/14/2013 Reason for consult: Follow-up assessment   Maternal Data    Feeding Feeding Type: Breast Fed Length of feed: 30 min  LATCH Score/Interventions Latch: Grasps breast easily, tongue down, lips flanged, rhythmical sucking.  Audible Swallowing: A few with stimulation Intervention(s): Alternate breast massage  Type of Nipple: Everted at rest and after stimulation  Comfort (Breast/Nipple): Soft / non-tender     Hold (Positioning): Assistance needed to correctly position infant at breast and maintain latch.  LATCH Score: 8  Lactation Tools Discussed/Used     Consult Status Consult Status: PRN    Dahlia ByesBerkelhammer, Ruth [facility] 07/14/2013, 10:21 AM

## 2013-07-14 NOTE — Discharge Summary (Signed)
Obstetric Discharge Summary Reason for Admission: onset of labor Prenatal Procedures: none Intrapartum Procedures: spontaneous vaginal delivery Postpartum Procedures: none Complications-Operative and Postpartum: none Hemoglobin  Date Value Ref Range Status  07/13/2013 11.8* 12.0 - 15.0 g/dL Final     HCT  Date Value Ref Range Status  07/13/2013 33.7* 36.0 - 46.0 % Final    Physical Exam:  General: alert Lochia: appropriate Uterine Fundus: firm Incision: healing well DVT Evaluation: No evidence of DVT seen on physical exam.  Discharge Diagnoses: Term Pregnancy-delivered  Discharge Information: Date: 07/14/2013 Activity: pelvic rest Diet: routine Medications: Percocet Condition: improved Instructions: refer to practice specific booklet Discharge to: home Follow-up Information   Follow up with Kathreen CosierMARSHALL,BERNARD A, MD.   Specialty:  Obstetrics and Gynecology   Contact information:   [address] [place] [id number] [phone]       Newborn Data: Live born female  Birth Weight: 6 lb 8.8 oz (2970 g) APGAR: 7, 9  Home with mother.  MARSHALL,BERNARD A 07/14/2013, 6:24 AM

## 2013-09-10 ENCOUNTER — Ambulatory Visit (HOSPITAL_COMMUNITY)
Admission: RE | Admit: 2013-09-10 | Discharge: 2013-09-10 | Disposition: A | Discharge status: Home / Self Care | Payer: Medicaid Other | Source: Ambulatory Visit | Attending: Obstetrics | Admitting: Obstetrics

## 2013-09-10 NOTE — Lactation Note (Addendum)
Lactation Consult  Mother's reason for visit:  Mother has concerns about milk supply and if infant is taking in enough, as well as painful latch.  Visit Type:  Feeding assessment Appointment Notes: Assist mother with proper latch technique. Infant has been latching on with shallow latch. Mother taught how to adjust infants lower jaw for wider gape and to row infants top lip upward. Mother denies pain with latch. Observed infant with consistent audible swallows. Infant latched for 12 mins and transferred 76 ml . Infant then offered alternate breast and transfered another 32 ml . Total feeding was 108 ml. Mother was given lot of support and praise.   Consult:  Initial Lactation Consultant:  Michel Bickers  ________________________________________________________________________  Joan Flores Name: Rhonda Fisher  Date of Birth: 07/12/2011  Pediatrician: Dr Holly Bodily Gender: female  Gestational Age: [redacted]w[redacted]d (At Birth)  Birth Weight: 6 lb 8.8 oz (2970 g)  Weight at Discharge: Weight: 6 lb 3.5 oz (2820 g) Date of Discharge: 07/14/2013  [facility] Weights   07/12/13 1109 07/13/13 0000 07/14/13 0020  Weight: 6 lb 8.8 oz (2970 g) 6 lb 6.8 oz (2915 g) 6 lb 3.5 oz (2820 g)   Weight today: 10.-12, 4886   ________________________________________________________________________  Mother's Name: [place] P Rieger Type of delivery:   Breastfeeding Experience:  4-5 months with first child Maternal Medical Conditions:  none Maternal Medications:  Prenatal vits  ________________________________________________________________________  Breastfeeding History (Post Discharge)  Frequency of breastfeeding: every 2-3 hours Duration of feeding: 20 mins on one breast    Pumping  Type of pump:  first years pump Frequency:  3 times daily only when at work Volume:  6 ounces  Infant Intake and Output Assessment  Voids:  10-12 in 24 hrs.  Color:  Clear yellow Stools:  2-3 in 24 hrs.  Color:   Yellow  ________________________________________________________________________  Maternal Breast Assessment  Breast:  Full Nipple:  Erect Pain level:  3 Pain interventions:  Bra and Expressed breast milk  _______________________________________________________________________ Feeding Assessment/Evaluation  Initial feeding assessment:  Infant's oral assessment:  Variance, tight upper lip tie  Positioning:  Cross cradle Left breast  LATCH documentation:  Latch:  2 = Grasps breast easily, tongue down, lips flanged, rhythmical sucking.  Audible swallowing:  2 = Spontaneous and intermittent  Type of nipple:  2 = Everted at rest and after stimulation  Comfort (Breast/Nipple): 1  Hold (Positioning):  2 = No assistance needed to correctly position infant at breast  LATCH score:  9  Attached assessment:  Deep  Lips flanged:  Yes.    Lips untucked:  Yes.    Suck assessment:  Nutritive   Pre-feed weight:4886   g  (10-12 lb.) Post-feed weight: 4962g (10 lb.15 lb) Amount transferred: 76 ml  Additional Feeding Assessment -   Infant's oral assessment:  Variance  Positioning:  Football Left breast  LATCH documentation:  Latch:  2 = Grasps breast easily, tongue down, lips flanged, rhythmical sucking.  Audible swallowing:  2 = Spontaneous and intermittent  Type of nipple:  2 = Everted at rest and after stimulation  Comfort (Breast/Nipple):  1 = Filling, red/small blisters or bruises, mild/mod discomfort  Hold (Positioning):  2 = No assistance needed to correctly position infant at breast  LATCH score: 9  Attached assessment:  Deep  Lips flanged:  Yes.    Lips untucked:  Yes.    Suck assessment:  Nutritive   Pre-feed weight:  4962 g  (10lb15. oz.) Post-feed weight:  4994 g (  11 lb.0.1 oz.) Amount transferred: 32 ml   Total amount transferred:  108 ml Mother is to continue to cue base feed infant at least 8-12 times in 24 hours Mother to offer alternate breast when  feeding to top infants feeding off.  Advised mother to continue to pump at least 3 times while at work. Mother advised to continue to follow up prn [facility].

## 2013-09-20 ENCOUNTER — Ambulatory Visit (HOSPITAL_COMMUNITY)
Admission: RE | Admit: 2013-09-20 | Discharge: 2013-09-20 | Disposition: A | Discharge status: Home / Self Care | Payer: Medicaid Other | Source: Ambulatory Visit | Attending: Obstetrics | Admitting: Obstetrics

## 2013-09-20 NOTE — Lactation Note (Signed)
Lactation Consult  Mother's reason for visit:  POSSIBLE LOW MILK SUPPLY Visit Type:  FEEDING ASSESSMENT Appointment Notes: POSSIBLE LOW SUPPLY, CHECK PUMP Consult:  Follow-Up Lactation Consultant:  Huston Foley  ________________________________________________________________________   Rhonda Fisher Name: Rhonda Fisher  Date of Birth: 07/12/2011  Pediatrician: DR Holly Bodily  Gender: female  Gestational Age: [redacted]w[redacted]d (At Birth)  Birth Weight: 6 lb 8.8 oz (2970 g)  Weight at Discharge: Weight: 6 lb 3.5 oz (2820 g) Date of Discharge: 07/14/2013  [facility] Weights   07/12/13 1109 07/13/13 0000 07/14/13 0020  Weight: 6 lb 8.8 oz (2970 g) 6 lb 6.8 oz (2915 g) 6 lb 3.5 oz (2820 g)  [facility] 10-12 ON 8/14/15Location:LACTATION OFFICE  Weight today: 11-5  ________________________________________________________________________  Mother's Name: [place] P Balla Type of delivery:   Breastfeeding Experience:  BREAST FED FIRST FOR 5 MONTHS UNTIL SHE SELF WEANED Maternal Medical Conditions: HX OF HYPERTENSION Maternal Medications:  PNV'S, FENUGREEM  ________________________________________________________________________  Breastfeeding History (Post Discharge)  Frequency of breastfeeding: EVERY 2-3 HOURS Duration of feeding: 10-15 MINUTES    Pumping  Type of pump:  FIRST YEARS Frequency:  EVERY 2 HOURS WHEN AT WORK. WORK SCHEDULE 8 HOURS DAYS Volume:  60-47mls on right 30 mls on left  Infant Intake and Output Assessment  Voids:  8-10n 24 hrs.   Stools: 3 in 24 hrs.    ________________________________________________________________________  Maternal Breast Assessment  Breast:  Soft Nipple:  Erect Pain level:  0   _______________________________________________________________________ Feeding Assessment/Evaluation  Mom and 6 month old infant here for follow up consultation.  Mom is concerned that since back to work for 3 weeks she is only  pumping 30 mls from left breast.  She pumps 3-5 ounces from right breast every 2 hours so still exclusively feeding breast milk.   Baby has gained 9 ounces/9 days.  Mom is using a first years pump.  She started taking fenugreek today.  Mom is very motivated to provide breast milk for her baby.  Observed baby latch well to right breast and nurse actively.  Baby came off breast smiling and content.  Recommended mom call WIC for a DEBP which is more likely to be more effective at emptying breasts.  Discussed relaxation, breast massage and smelling infants clothing during pumping.  At this time milk supply is sufficient for baby.  Mom praised for her commitment and efforts.  Encouraged to call for concerns prn.  Initial feeding assessment:  Infant's oral assessment:  WNL  Positioning:  Cradle Right breast  LATCH documentation:  Latch:  2 = Grasps breast easily, tongue down, lips flanged, rhythmical sucking.  Audible swallowing:  2 = Spontaneous and intermittent  Type of nipple:  2 = Everted at rest and after stimulation  Comfort (Breast/Nipple):  2 = Soft / non-tender  Hold (Positioning):  2 = No assistance needed to correctly position infant at breast  LATCH score:  10  Attached assessment:  Deep  Lips flanged:  Yes.    Lips untucked:  No.  Suck assessment:  Nutritive     Pre-feed weight:  5130 g   Post-feed weight:  5196 g  Amount transferred:  66 ml Amount supplemented:  0 ml  Mom fed on left breast prior to appointment    Total amount transferred:  66 ml Total supplement given:  0 ml

## 2013-11-29 ENCOUNTER — Encounter (HOSPITAL_COMMUNITY): Payer: Self-pay | Admitting: *Deleted

## 2014-08-23 ENCOUNTER — Ambulatory Visit: Payer: Medicaid Other | Attending: Internal Medicine

## 2014-12-11 ENCOUNTER — Emergency Department (HOSPITAL_COMMUNITY)
Admission: EM | Admit: 2014-12-11 | Discharge: 2014-12-11 | Disposition: A | Discharge status: Home / Self Care | Payer: Medicaid Other | Attending: Emergency Medicine | Admitting: Emergency Medicine

## 2014-12-11 ENCOUNTER — Encounter (HOSPITAL_COMMUNITY): Payer: Self-pay | Admitting: Emergency Medicine

## 2014-12-11 ENCOUNTER — Emergency Department (HOSPITAL_COMMUNITY): Payer: Medicaid Other

## 2014-12-11 DIAGNOSIS — Z87891 Personal history of nicotine dependence: Secondary | ICD-10-CM | POA: Insufficient documentation

## 2014-12-11 DIAGNOSIS — I10 Essential (primary) hypertension: Secondary | ICD-10-CM | POA: Insufficient documentation

## 2014-12-11 DIAGNOSIS — Y998 Other external cause status: Secondary | ICD-10-CM | POA: Insufficient documentation

## 2014-12-11 DIAGNOSIS — S93401A Sprain of unspecified ligament of right ankle, initial encounter: Secondary | ICD-10-CM | POA: Insufficient documentation

## 2014-12-11 DIAGNOSIS — X58XXXA Exposure to other specified factors, initial encounter: Secondary | ICD-10-CM | POA: Insufficient documentation

## 2014-12-11 DIAGNOSIS — Y9289 Other specified places as the place of occurrence of the external cause: Secondary | ICD-10-CM | POA: Insufficient documentation

## 2014-12-11 DIAGNOSIS — Y9389 Activity, other specified: Secondary | ICD-10-CM | POA: Insufficient documentation

## 2014-12-11 MED ORDER — HYDROCODONE-ACETAMINOPHEN 5-325 MG PO TABS: 1.0000 | ORAL_TABLET | Freq: Four times a day (QID) | ORAL | Status: DC | PRN

## 2014-12-11 MED ORDER — IBUPROFEN 800 MG PO TABS
800.0000 mg | ORAL_TABLET | Freq: Once | ORAL | Status: AC
  Administered 2014-12-11: 800 mg via ORAL
  Filled 2014-12-11: qty 1

## 2014-12-11 MED ORDER — IBUPROFEN 800 MG PO TABS: 800.0000 mg | ORAL_TABLET | Freq: Three times a day (TID) | ORAL | Status: DC

## 2014-12-11 NOTE — ED Provider Notes (Signed)
CSN: [id number]     Arrival date & time 12/11/14  1058 History  By signing my name below, I, Soijett Blue, attest that this documentation has been prepared under the direction and in the presence of Danelle Berry, PA-C Electronically Signed: Soijett Blue, ED Scribe. 12/11/2014. 1:04 PM.  Chief Complaint  Patient presents with  . Ankle Pain      The history is provided by the patient. No language interpreter was used.    Rhonda Fisher is a 34 y.o. female with a medical hx of HTN who presents to the Emergency Department complaining of constant, aching/throbbing, worsening, right ankle pain onset yesterday. She states that she twisted her right ankle yesterday was able to ambulate slowly and her pain worsened following the incident. She notes that at the initially, she felt a sharp pain go up her right leg. She reports that there is pain with weight bearing at this time. She notes that she has tried RICE method, tylenol, with no relief of her symptoms. She denies color change, wound, rash, joint swelling, right calf tenderness, and any other symptoms. 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   Past Medical History  Diagnosis Date  . Abnormal Pap smear     colpo wnl  . Hypertension     currently no meds   Past Surgical History  Procedure Laterality Date  . Hernia repair    . Colposcopy     Family History  Problem Relation Age of Onset  . Anesthesia problems Neg Hx   . Hypertension Mother   . Diabetes Mother   . Hypertension Father   . Diabetes Father    Social History  Substance Use Topics  . Smoking status: Former Smoker -- 0.25 packs/day    Types: Cigarettes    Quit date: 06/11/2011  . Smokeless tobacco: None  . Alcohol Use: No     Comment: ocassional   OB History    Gravida Para Term Preterm AB TAB SAB Ectopic Multiple Living   0 0 0 0 0 0 2     Review of Systems  Musculoskeletal: Positive for arthralgias. Negative for joint swelling and gait  problem.  Skin: Negative for color change, rash and wound.    Allergies  Review of patient's allergies indicates no known allergies.  Home Medications   Prior to Admission medications   Medication Sig Start Date End Date Taking? Authorizing Provider  HYDROcodone-acetaminophen (NORCO/VICODIN) 5-325 MG tablet Take 1-2 tablets by mouth every 6 (six) hours as needed for severe pain. 12/11/14   Danelle Berry, PA-C  ibuprofen (ADVIL,MOTRIN) 800 MG tablet Take 1 tablet (800 mg total) by mouth 3 (three) times daily. 12/11/14   Danelle Berry, PA-C   BP 128/83 mmHg  Pulse 70  Temp(Src) 97.6 F (36.4 C) (Oral)  Resp 17  SpO2 98%  LMP 12/05/2014 Physical Exam  Constitutional: She is oriented to person, place, and time. She appears well-developed and well-nourished. No distress.  HENT:  Head: Normocephalic and atraumatic.  Right Ear: External ear normal.  Left Ear: External ear normal.  Nose: Nose normal.  Mouth/Throat: Oropharynx is clear and moist. No oropharyngeal exudate.  Eyes: Conjunctivae and EOM are normal. Pupils are equal, round, and reactive to light. Right eye exhibits no discharge. Left eye exhibits no discharge. No scleral icterus.  Neck: Normal range of motion. Neck supple. No JVD present. No tracheal deviation present.  Cardiovascular: Normal rate and regular rhythm.  Pulmonary/Chest: Effort normal and breath sounds normal. No stridor. No respiratory distress.  Musculoskeletal: Normal range of motion. She exhibits no edema.       Right ankle: She exhibits normal range of motion. Tenderness. Lateral malleolus, AITFL, CF ligament, posterior TFL and head of 5th metatarsal tenderness found. No medial malleolus and no proximal fibula tenderness found. Achilles tendon normal.  Tenderness to the lateral malleolus of the right ankle. Tenderness to the AITFL, CF ligament, posterior TFL, and head of 5th MT. Nl ROM and nl sensation.    Lymphadenopathy:    She has no cervical adenopathy.   Neurological: She is alert and oriented to person, place, and time. She exhibits normal muscle tone. Coordination normal.  Skin: Skin is warm and dry. No rash noted. She is not diaphoretic. No erythema. No pallor.  Psychiatric: She has a normal mood and affect. Her behavior is normal. Judgment and thought content normal.  Nursing note and vitals reviewed.   ED Course  Procedures (including critical care time) DIAGNOSTIC STUDIES: Oxygen Saturation is 99% on RA, nl by my interpretation.    COORDINATION OF CARE: 12:43 PM Discussed treatment plan with pt at bedside which includes right ankle xray, ibuprofen, ankle splint, RICE, pain medication Rx, and referral to orthopedist and pt agreed to plan.   Labs Review Labs Reviewed - No data to display  Imaging Review Dg Ankle Complete Right  12/11/2014  CLINICAL DATA:  Pt was walking and "rolled" right ankle and complains of pain and inability to put pressure on foot. EXAM: RIGHT ANKLE - COMPLETE 3+ VIEW COMPARISON:  None. FINDINGS: There is no evidence of fracture, dislocation, or joint effusion. There is no evidence of arthropathy or other focal bone abnormality. Soft tissues are unremarkable. IMPRESSION: Negative. Electronically Signed   By: Amie Portlandavid  Ormond M.D.   On: 12/11/2014 11:56   I have personally reviewed and evaluated these images as part of my medical decision-making.   EKG Interpretation None      MDM   Final diagnoses:  Ankle sprain, right, initial encounter    Patient with right ankle pain, xrays negative for fracture.   And ankle air splint was applied, patient was given crutches. RICE tx reviewed with the pt.  She will follow-up with her PCP if not improving after 1-2 weeks.  I personally performed the services described in this documentation, which was scribed in my presence. The recorded information has been reviewed and is accurate.      Danelle BerryLeisa Isobelle Tuckett, PA-C 12/12/14 2245  Tilden FossaElizabeth Rees, MD 12/16/14 [phone]

## 2014-12-11 NOTE — Discharge Instructions (Signed)
Acute Ankle Sprain With Phase I Rehab An acute ankle sprain is a partial or complete tear in one or more of the ligaments of the ankle due to traumatic injury. The severity of the injury depends on both the number of ligaments sprained and the grade of sprain. There are 3 grades of sprains.   A grade 1 sprain is a mild sprain. There is a slight pull without obvious tearing. There is no loss of strength, and the muscle and ligament are the correct length.  A grade 2 sprain is a moderate sprain. There is tearing of fibers within the substance of the ligament where it connects two bones or two cartilages. The length of the ligament is increased, and there is usually decreased strength.  A grade 3 sprain is a complete rupture of the ligament and is uncommon. In addition to the grade of sprain, there are three types of ankle sprains.  Lateral ankle sprains: This is a sprain of one or more of the three ligaments on the outer side (lateral) of the ankle. These are the most common sprains. Medial ankle sprains: There is one large triangular ligament of the inner side (medial) of the ankle that is susceptible to injury. Medial ankle sprains are less common. Syndesmosis, "high ankle," sprains: The syndesmosis is the ligament that connects the two bones of the lower leg. Syndesmosis sprains usually only occur with very severe ankle sprains. SYMPTOMS  Pain, tenderness, and swelling in the ankle, starting at the side of injury that may progress to the whole ankle and foot with time.  "Pop" or tearing sensation at the time of injury.  Bruising that may spread to the heel.  Impaired ability to walk soon after injury. CAUSES   Acute ankle sprains are caused by trauma placed on the ankle that temporarily forces or pries the anklebone (talus) out of its normal socket.  Stretching or tearing of the ligaments that normally hold the joint in place (usually due to a twisting injury). RISK INCREASES  WITH:  Previous ankle sprain.  Sports in which the foot may land awkwardly (i.e., basketball, volleyball, or soccer) or walking or running on uneven or rough surfaces.  Shoes with inadequate support to prevent sideways motion when stress occurs.  Poor strength and flexibility.  Poor balance skills.  Contact sports. PREVENTION   Warm up and stretch properly before activity.  Maintain physical fitness:  Ankle and leg flexibility, muscle strength, and endurance.  Cardiovascular fitness.  Balance training activities.  Use proper technique and have a coach correct improper technique.  Taping, protective strapping, bracing, or high-top tennis shoes may help prevent injury. Initially, tape is best; however, it loses most of its support function within 10 to 15 minutes.  Wear proper-fitted protective shoes (High-top shoes with taping or bracing is more effective than either alone).  Provide the ankle with support during sports and practice activities for 12 months following injury. PROGNOSIS   If treated properly, ankle sprains can be expected to recover completely; however, the length of recovery depends on the degree of injury.  A grade 1 sprain usually heals enough in 5 to 7 days to allow modified activity and requires an average of 6 weeks to heal completely.  A grade 2 sprain requires 6 to 10 weeks to heal completely.  A grade 3 sprain requires 12 to 16 weeks to heal.  A syndesmosis sprain often takes more than 3 months to heal. RELATED COMPLICATIONS   Frequent recurrence of symptoms may  result in a chronic problem. Appropriately addressing the problem the first time decreases the frequency of recurrence and optimizes healing time. Severity of the initial sprain does not predict the likelihood of later instability. °· Injury to other structures (bone, cartilage, or tendon). °· A chronically unstable or arthritic ankle joint is a possibility with repeated  sprains. °TREATMENT °Treatment initially involves the use of ice, medication, and compression bandages to help reduce pain and inflammation. Ankle sprains are usually immobilized in a walking cast or boot to allow for healing. Crutches may be recommended to reduce pressure on the injury. After immobilization, strengthening and stretching exercises may be necessary to regain strength and a full range of motion. Surgery is rarely needed to treat ankle sprains. °MEDICATION  °· Nonsteroidal anti-inflammatory medications, such as aspirin and ibuprofen (do not take for the first 3 days after injury or within 7 days before surgery), or other minor pain relievers, such as acetaminophen, are often recommended. Take these as directed by your caregiver. Contact your caregiver immediately if any bleeding, stomach upset, or signs of an allergic reaction occur from these medications. °· Ointments applied to the skin may be helpful. °· Pain relievers may be prescribed as necessary by your caregiver. Do not take prescription pain medication for longer than 4 to 7 days. Use only as directed and only as much as you need. °HEAT AND COLD °· Cold treatment (icing) is used to relieve pain and reduce inflammation for acute and chronic cases. Cold should be applied for 10 to 15 minutes every 2 to 3 hours for inflammation and pain and immediately after any activity that aggravates your symptoms. Use ice packs or an ice massage. °· Heat treatment may be used before performing stretching and strengthening activities prescribed by your caregiver. Use a heat pack or a warm soak. °SEEK IMMEDIATE MEDICAL CARE IF:  °· Pain, swelling, or bruising worsens despite treatment. °· You experience pain, numbness, discoloration, or coldness in the foot or toes. °· New, unexplained symptoms develop (drugs used in treatment may produce side effects.) °EXERCISES  °PHASE I EXERCISES °RANGE OF MOTION (ROM) AND STRETCHING EXERCISES - Ankle Sprain, Acute Phase I,  Weeks 1 to 2 °These exercises may help you when beginning to restore flexibility in your ankle. You will likely work on these exercises for the 1 to 2 weeks after your injury. Once your physician, physical therapist, or athletic trainer sees adequate progress, he or she will advance your exercises. While completing these exercises, remember:  °· Restoring tissue flexibility helps normal motion to return to the joints. This allows healthier, less painful movement and activity. °· An effective stretch should be held for at least 30 seconds. °· A stretch should never be painful. You should only feel a gentle lengthening or release in the stretched tissue. °RANGE OF MOTION - Dorsi/Plantar Flexion °· While sitting with your right / left knee straight, draw the top of your foot upwards by flexing your ankle. Then reverse the motion, pointing your toes downward. °· Hold each position for __________ seconds. °· After completing your first set of exercises, repeat this exercise with your knee bent. °Repeat __________ times. Complete this exercise __________ times per day.  °RANGE OF MOTION - Ankle Alphabet °· Imagine your right / left big toe is a pen. °· Keeping your hip and knee still, write out the entire alphabet with your "pen." Make the letters as large as you can without increasing any discomfort. °Repeat __________ times. Complete this exercise __________   times per day.  °STRENGTHENING EXERCISES - Ankle Sprain, Acute -Phase I, Weeks 1 to 2 °These exercises may help you when beginning to restore strength in your ankle. You will likely work on these exercises for 1 to 2 weeks after your injury. Once your physician, physical therapist, or athletic trainer sees adequate progress, he or she will advance your exercises. While completing these exercises, remember:  °· Muscles can gain both the endurance and the strength needed for everyday activities through controlled exercises. °· Complete these exercises as instructed by  your physician, physical therapist, or athletic trainer. Progress the resistance and repetitions only as guided. °· You may experience muscle soreness or fatigue, but the pain or discomfort you are trying to eliminate should never worsen during these exercises. If this pain does worsen, stop and make certain you are following the directions exactly. If the pain is still present after adjustments, discontinue the exercise until you can discuss the trouble with your clinician. °STRENGTH - Dorsiflexors °· Secure a rubber exercise band/tubing to a fixed object (i.e., table, pole) and loop the other end around your right / left foot. °· Sit on the floor facing the fixed object. The band/tubing should be slightly tense when your foot is relaxed. °· Slowly draw your foot back toward you using your ankle and toes. °· Hold this position for __________ seconds. Slowly release the tension in the band and return your foot to the starting position. °Repeat __________ times. Complete this exercise __________ times per day.  °STRENGTH - Plantar-flexors  °· Sit with your right / left leg extended. Holding onto both ends of a rubber exercise band/tubing, loop it around the ball of your foot. Keep a slight tension in the band. °· Slowly push your toes away from you, pointing them downward. °· Hold this position for __________ seconds. Return slowly, controlling the tension in the band/tubing. °Repeat __________ times. Complete this exercise __________ times per day.  °STRENGTH - Ankle Eversion °· Secure one end of a rubber exercise band/tubing to a fixed object (table, pole). Loop the other end around your foot just before your toes. °· Place your fists between your knees. This will focus your strengthening at your ankle. °· Drawing the band/tubing across your opposite foot, slowly, pull your little toe out and up. Make sure the band/tubing is positioned to resist the entire motion. °· Hold this position for __________ seconds. °Have  your muscles resist the band/tubing as it slowly pulls your foot back to the starting position.  °Repeat __________ times. Complete this exercise __________ times per day.  °STRENGTH - Ankle Inversion °· Secure one end of a rubber exercise band/tubing to a fixed object (table, pole). Loop the other end around your foot just before your toes. °· Place your fists between your knees. This will focus your strengthening at your ankle. °· Slowly, pull your big toe up and in, making sure the band/tubing is positioned to resist the entire motion. °· Hold this position for __________ seconds. °· Have your muscles resist the band/tubing as it slowly pulls your foot back to the starting position. °Repeat __________ times. Complete this exercises __________ times per day.  °STRENGTH - Towel Curls °· Sit in a chair positioned on a non-carpeted surface. °· Place your right / left foot on a towel, keeping your heel on the floor. °· Pull the towel toward your heel by only curling your toes. Keep your heel on the floor. °· If instructed by your physician, physical therapist,   or athletic trainer, add weight to the end of the towel. Repeat __________ times. Complete this exercise __________ times per day.   This information is not intended to replace advice given to you by your health care provider. Make sure you discuss any questions you have with your health care provider.   Document Released: 08/15/2004 Document Revised: 02/04/2014 Document Reviewed: 04/28/2008 Else[employer] Interactive Patient Education 2016 [employer]  Ankle Sprain An ankle sprain is an injury to the strong, fibrous tissues (ligaments) that hold the bones of your ankle joint together.  CAUSES An ankle sprain is usually caused by a fall or by twisting your ankle. Ankle sprains most commonly occur when you step on the outer edge of your foot, and your ankle turns inward. People who participate in sports are more prone to these types of injuries.   SYMPTOMS   Pain in your ankle. The pain may be present at rest or only when you are trying to stand or walk.  Swelling.  Bruising. Bruising may develop immediately or within 1 to 2 days after your injury.  Difficulty standing or walking, particularly when turning corners or changing directions. DIAGNOSIS  Your caregiver will ask you details about your injury and perform a physical exam of your ankle to determine if you have an ankle sprain. During the physical exam, your caregiver will press on and apply pressure to specific areas of your foot and ankle. Your caregiver will try to move your ankle in certain ways. An X-ray exam may be done to be sure a bone was not broken or a ligament did not separate from one of the bones in your ankle (avulsion fracture).  TREATMENT  Certain types of braces can help stabilize your ankle. Your caregiver can make a recommendation for this. Your caregiver may recommend the use of medicine for pain. If your sprain is severe, your caregiver may refer you to a surgeon who helps to restore function to parts of your skeletal system (orthopedist) or a physical therapist. HOME CARE INSTRUCTIONS   Apply ice to your injury for 1-2 days or as directed by your caregiver. Applying ice helps to reduce inflammation and pain.  Put ice in a plastic bag.  Place a towel between your skin and the bag.  Leave the ice on for 15-20 minutes at a time, every 2 hours while you are awake.  Only take over-the-counter or prescription medicines for pain, discomfort, or fever as directed by your caregiver.  Elevate your injured ankle above the level of your heart as much as possible for 2-3 days.  If your caregiver recommends crutches, use them as instructed. Gradually put weight on the affected ankle. Continue to use crutches or a cane until you can walk without feeling pain in your ankle.  If you have a plaster splint, wear the splint as directed by your caregiver. Do not rest it  on anything harder than a pillow for the first 24 hours. Do not put weight on it. Do not get it wet. You may take it off to take a shower or bath.  You may have been given an elastic bandage to wear around your ankle to provide support. If the elastic bandage is too tight (you have numbness or tingling in your foot or your foot becomes cold and blue), adjust the bandage to make it comfortable.  If you have an air splint, you may blow more air into it or let air out to make it more comfortable. You may  take your splint off at night and before taking a shower or bath. Wiggle your toes in the splint several times per day to decrease swelling. SEEK MEDICAL CARE IF:   You have rapidly increasing bruising or swelling.  Your toes feel extremely cold or you lose feeling in your foot.  Your pain is not relieved with medicine. SEEK IMMEDIATE MEDICAL CARE IF:  Your toes are numb or blue.  You have severe pain that is increasing. MAKE SURE YOU:   Understand these instructions.  Will watch your condition.  Will get help right away if you are not doing well or get worse.   This information is not intended to replace advice given to you by your health care provider. Make sure you discuss any questions you have with your health care provider.   Document Released: 01/14/2005 Document Revised: 02/04/2014 Document Reviewed: 01/26/2011 Else[employer] Interactive Patient Education 2016 [employer]  Ankle Sprain An ankle sprain is an injury to the strong, fibrous tissues (ligaments) that hold the bones of your ankle joint together.  CAUSES An ankle sprain is usually caused by a fall or by twisting your ankle. Ankle sprains most commonly occur when you step on the outer edge of your foot, and your ankle turns inward. People who participate in sports are more prone to these types of injuries.  SYMPTOMS   Pain in your ankle. The pain may be present at rest or only when you are trying to stand or  walk.  Swelling.  Bruising. Bruising may develop immediately or within 1 to 2 days after your injury.  Difficulty standing or walking, particularly when turning corners or changing directions. DIAGNOSIS  Your caregiver will ask you details about your injury and perform a physical exam of your ankle to determine if you have an ankle sprain. During the physical exam, your caregiver will press on and apply pressure to specific areas of your foot and ankle. Your caregiver will try to move your ankle in certain ways. An X-ray exam may be done to be sure a bone was not broken or a ligament did not separate from one of the bones in your ankle (avulsion fracture).  TREATMENT  Certain types of braces can help stabilize your ankle. Your caregiver can make a recommendation for this. Your caregiver may recommend the use of medicine for pain. If your sprain is severe, your caregiver may refer you to a surgeon who helps to restore function to parts of your skeletal system (orthopedist) or a physical therapist. HOME CARE INSTRUCTIONS   Apply ice to your injury for 1-2 days or as directed by your caregiver. Applying ice helps to reduce inflammation and pain.  Put ice in a plastic bag.  Place a towel between your skin and the bag.  Leave the ice on for 15-20 minutes at a time, every 2 hours while you are awake.  Only take over-the-counter or prescription medicines for pain, discomfort, or fever as directed by your caregiver.  Elevate your injured ankle above the level of your heart as much as possible for 2-3 days.  If your caregiver recommends crutches, use them as instructed. Gradually put weight on the affected ankle. Continue to use crutches or a cane until you can walk without feeling pain in your ankle.  If you have a plaster splint, wear the splint as directed by your caregiver. Do not rest it on anything harder than a pillow for the first 24 hours. Do not put weight on it. Do  not get it wet. You  may take it off to take a shower or bath.  You may have been given an elastic bandage to wear around your ankle to provide support. If the elastic bandage is too tight (you have numbness or tingling in your foot or your foot becomes cold and blue), adjust the bandage to make it comfortable.  If you have an air splint, you may blow more air into it or let air out to make it more comfortable. You may take your splint off at night and before taking a shower or bath. Wiggle your toes in the splint several times per day to decrease swelling. SEEK MEDICAL CARE IF:   You have rapidly increasing bruising or swelling.  Your toes feel extremely cold or you lose feeling in your foot.  Your pain is not relieved with medicine. SEEK IMMEDIATE MEDICAL CARE IF:  Your toes are numb or blue.  You have severe pain that is increasing. MAKE SURE YOU:   Understand these instructions.  Will watch your condition.  Will get help right away if you are not doing well or get worse.   This information is not intended to replace advice given to you by your health care provider. Make sure you discuss any questions you have with your health care provider.   Document Released: 01/14/2005 Document Revised: 02/04/2014 Document Reviewed: 01/26/2011 Elsevier Interactive Patient Education 2016 [employer]  Cryotherapy Cryotherapy means treatment with cold. Ice or gel packs can be used to reduce both pain and swelling. Ice is the most helpful within the first 24 to 48 hours after an injury or flare-up from overusing a muscle or joint. Sprains, strains, spasms, burning pain, shooting pain, and aches can all be eased with ice. Ice can also be used when recovering from surgery. Ice is effective, has very few side effects, and is safe for most people to use. PRECAUTIONS  Ice is not a safe treatment option for people with:  Raynaud phenomenon. This is a condition affecting small blood vessels in the extremities. Exposure  to cold may cause your problems to return.  Cold hypersensitivity. There are many forms of cold hypersensitivity, including:  Cold urticaria. Red, itchy hives appear on the skin when the tissues begin to warm after being iced.  Cold erythema. This is a red, itchy rash caused by exposure to cold.  Cold hemoglobinuria. Red blood cells break down when the tissues begin to warm after being iced. The hemoglobin that carry oxygen are passed into the urine because they cannot combine with blood proteins fast enough.  Numbness or altered sensitivity in the area being iced. If you have any of the following conditions, do not use ice until you have discussed cryotherapy with your caregiver:  Heart conditions, such as arrhythmia, angina, or chronic heart disease.  High blood pressure.  Healing wounds or open skin in the area being iced.  Current infections.  Rheumatoid arthritis.  Poor circulation.  Diabetes. Ice slows the blood flow in the region it is applied. This is beneficial when trying to stop inflamed tissues from spreading irritating chemicals to surrounding tissues. However, if you expose your skin to cold temperatures for too long or without the proper protection, you can damage your skin or nerves. Watch for signs of skin damage due to cold. HOME CARE INSTRUCTIONS Follow these tips to use ice and cold packs safely.  Place a dry or damp towel between the ice and skin. A damp towel will cool  the skin more quickly, so you may need to shorten the time that the ice is used.  For a more rapid response, add gentle compression to the ice.  Ice for no more than 10 to 20 minutes at a time. The bonier the area you are icing, the less time it will take to get the benefits of ice.  Check your skin after 5 minutes to make sure there are no signs of a poor response to cold or skin damage.  Rest 20 minutes or more between uses.  Once your skin is numb, you can end your treatment. You can  test numbness by very lightly touching your skin. The touch should be so light that you do not see the skin dimple from the pressure of your fingertip. When using ice, most people will feel these normal sensations in this order: cold, burning, aching, and numbness.  Do not use ice on someone who cannot communicate their responses to pain, such as small children or people with dementia. HOW TO MAKE AN ICE PACK Ice packs are the most common way to use ice therapy. Other methods include ice massage, ice baths, and cryosprays. Muscle creams that cause a cold, tingly feeling do not offer the same benefits that ice offers and should not be used as a substitute unless recommended by your caregiver. To make an ice pack, do one of the following:  Place crushed ice or a bag of frozen vegetables in a sealable plastic bag. Squeeze out the excess air. Place this bag inside another plastic bag. Slide the bag into a pillowcase or place a damp towel between your skin and the bag.  Mix 3 parts water with 1 part rubbing alcohol. Freeze the mixture in a sealable plastic bag. When you remove the mixture from the freezer, it will be slushy. Squeeze out the excess air. Place this bag inside another plastic bag. Slide the bag into a pillowcase or place a damp towel between your skin and the bag. SEEK MEDICAL CARE IF:  You develop white spots on your skin. This may give the skin a blotchy (mottled) appearance.  Your skin turns blue or pale.  Your skin becomes waxy or hard.  Your swelling gets worse. MAKE SURE YOU:   Understand these instructions.  Will watch your condition.  Will get help right away if you are not doing well or get worse.   This information is not intended to replace advice given to you by your health care provider. Make sure you discuss any questions you have with your health care provider.   Document Released: 09/10/2010 Document Revised: 02/04/2014 Document Reviewed: 09/10/2010 [employer]  Interactive Patient Education [employer]

## 2014-12-11 NOTE — ED Notes (Signed)
Patient transported to X-ray 

## 2014-12-11 NOTE — ED Notes (Signed)
Pt states that she twisted her ankle yesterday and tried to RICE it but today it feels worse.  No deformity.

## 2014-12-11 NOTE — ED Notes (Signed)
Ortho tech at bedside 

## 2015-02-24 ENCOUNTER — Emergency Department (HOSPITAL_COMMUNITY): Payer: Medicaid Other

## 2015-02-24 ENCOUNTER — Encounter (HOSPITAL_COMMUNITY): Payer: Self-pay

## 2015-02-24 ENCOUNTER — Emergency Department (HOSPITAL_COMMUNITY)
Admission: EM | Admit: 2015-02-24 | Discharge: 2015-02-24 | Disposition: A | Discharge status: Home / Self Care | Payer: Medicaid Other | Attending: Emergency Medicine | Admitting: Emergency Medicine

## 2015-02-24 DIAGNOSIS — S83105A Unspecified dislocation of left knee, initial encounter: Secondary | ICD-10-CM | POA: Insufficient documentation

## 2015-02-24 DIAGNOSIS — S0181XA Laceration without foreign body of other part of head, initial encounter: Secondary | ICD-10-CM | POA: Insufficient documentation

## 2015-02-24 DIAGNOSIS — Y92000 Kitchen of unspecified non-institutional (private) residence as  the place of occurrence of the external cause: Secondary | ICD-10-CM | POA: Insufficient documentation

## 2015-02-24 DIAGNOSIS — Z87891 Personal history of nicotine dependence: Secondary | ICD-10-CM | POA: Insufficient documentation

## 2015-02-24 DIAGNOSIS — Y998 Other external cause status: Secondary | ICD-10-CM | POA: Insufficient documentation

## 2015-02-24 DIAGNOSIS — I10 Essential (primary) hypertension: Secondary | ICD-10-CM | POA: Insufficient documentation

## 2015-02-24 DIAGNOSIS — Y9301 Activity, walking, marching and hiking: Secondary | ICD-10-CM | POA: Insufficient documentation

## 2015-02-24 DIAGNOSIS — Z79899 Other long term (current) drug therapy: Secondary | ICD-10-CM | POA: Insufficient documentation

## 2015-02-24 DIAGNOSIS — W01198A Fall on same level from slipping, tripping and stumbling with subsequent striking against other object, initial encounter: Secondary | ICD-10-CM | POA: Insufficient documentation

## 2015-02-24 MED ORDER — LIDOCAINE-EPINEPHRINE 1 %-1:100000 IJ SOLN
10.0000 mL | Freq: Once | INTRAMUSCULAR | Status: AC
  Administered 2015-02-24: 10 mL
  Filled 2015-02-24: qty 1

## 2015-02-24 MED ORDER — IBUPROFEN 800 MG PO TABS: 800.0000 mg | ORAL_TABLET | Freq: Three times a day (TID) | ORAL | Status: DC

## 2015-02-24 MED ORDER — IBUPROFEN 800 MG PO TABS
800.0000 mg | ORAL_TABLET | Freq: Once | ORAL | Status: AC
  Administered 2015-02-24: 800 mg via ORAL
  Filled 2015-02-24: qty 1

## 2015-02-24 NOTE — Discharge Instructions (Signed)
Knee Dislocation Knee dislocation is when the bones that make up the knee joint move out of their normal position. Usually, if your knee is dislocated, at least two of the strong cords that hold your bones in place (ligaments) are also damaged. Knee dislocation is often caused by a sports accident or a car accident. This is a serious injury. Your doctor needs to put the knee bones back in place right away. This may be done with or without surgery. HOME CARE If You Have a Splint:  Wear it as told by your doctor.  Take it off only as told by your doctor.  Loosen it if:   Your foot or toes become numb and tingle.  Your foot or toes turn cold and blue.  Keep it clean and dry. Bathing  Do not take baths, swim, or use a hot tub until your doctor says that you can. Ask your doctor if you can take showers. You may only be allowed to take sponge baths.  If your doctor says that taking baths and showers is okay, cover the splint with a plastic bag. Do not let the splint get wet. Managing Pain, Stiffness, and Swelling  If told, put ice on the injured area.  Put ice in a plastic bag.  Place a towel between your skin and the bag.  Leave the ice on for 20 minutes, 2-3 times per day.  Move your toes often to avoid stiffness and to lessen swelling.  Raise (elevate) the injured area above the level of your heart while you are sitting or lying down. Activity  Do not use the injured leg to support your body weight until your doctor says that you can. Use crutches or other devices to help you walk as told by your doctor.  Return to your normal activities as told by your doctor. Ask your doctor what activities are safe for you.  Avoid hard (strenuous) activities for as long as told by your doctor. General Instructions  Take over-the-counter and prescription medicines only as told by your doctor.   Do not drive or use heavy machinery while taking prescription pain medicine. GET HELP  IF:  Your pain becomes worse, not better. GET HELP RIGHT AWAY IF:  You have very bad pain.  You begin to lose feeling in your foot, and adjusting your splint does not help.  You cannot move your ankle or toes.  Your foot or ankle turns color or feels cold.  You have chest pain or shortness of breath.   This information is not intended to replace advice given to you by your health care provider. Make sure you discuss any questions you have with your health care provider.   Document Released: 09/26/2010 Document Revised: 10/05/2014 Document Reviewed: 05/09/2014 Elsevier Interactive Patient Education 2016 Else[employer]  Facial Laceration A facial laceration is a cut on the face. These injuries can be painful and cause bleeding. Some cuts may need to be closed with stitches (sutures), skin adhesive strips, or wound glue. Cuts usually heal quickly but can leave a scar. It can take 1-2 years for the scar to go away completely. HOME CARE   Only take medicines as told by your doctor.  Follow your doctor's instructions for wound care. For Stitches:  Keep the cut clean and dry.  If you have a bandage (dressing), change it at least once a day. Change the bandage if it gets wet or dirty, or as told by your doctor.  Wash the cut  with soap and water 2 times a day. Rinse the cut with water. Pat it dry with a clean towel.  Put a thin layer of medicated cream on the cut as told by your doctor.  You may shower after the first 24 hours. Do not soak the cut in water until the stitches are removed.  Have your stitches removed as told by your doctor.  Do not wear any makeup until a few days after your stitches are removed. For Skin Adhesive Strips:  Keep the cut clean and dry.  Do not get the strips wet. You may take a bath, but be careful to keep the cut dry.  If the cut gets wet, pat it dry with a clean towel.  The strips will fall off on their own. Do not remove the strips that are  still stuck to the cut. For Wound Glue:  You may shower or take baths. Do not soak or scrub the cut. Do not swim. Avoid heavy sweating until the glue falls off on its own. After a shower or bath, pat the cut dry with a clean towel.  Do not put medicine or makeup on your cut until the glue falls off.  If you have a bandage, do not put tape over the glue.  Avoid lots of sunlight or tanning lamps until the glue falls off.  The glue will fall off on its own in 5-10 days. Do not pick at the glue. After Healing:  Put sunscreen on the cut for the first year to reduce your scar. GET HELP IF:  You have a fever. GET HELP RIGHT AWAY IF:   Your cut area gets red, painful, or puffy (swollen).  You see a yellowish-white fluid (pus) coming from the cut.   This information is not intended to replace advice given to you by your health care provider. Make sure you discuss any questions you have with your health care provider.   Document Released: 07/03/2007 Document Revised: 02/04/2014 Document Reviewed: 08/27/2012 Elsevier Interactive Patient Education 2016 Elsevier Inc[employer] You head wound was closed with under the skin suture and surgical glue  The glue is not pretty, but will hold the skin together for a few days to allow healing  The sutures will dissolve without having to be removed

## 2015-02-24 NOTE — ED Provider Notes (Signed)
CSN: [id number]     Arrival date & time 02/24/15  1859 History  By signing my name below, I, Budd Palmer, attest that this documentation has been prepared under the direction and in the presence of Arman Filter, NP. Electronically Signed: Budd Palmer, ED Scribe. 02/24/2015. 8:14 PM.    Chief Complaint  Patient presents with  . Fall  . Head Laceration   The history is provided by the patient. No language interpreter was used.   HPI Comments: Rhonda Fisher is a 35 y.o. female former smoker with a PMHx of HTN who presents to the Emergency Department complaining of a laceration to the forehead sustained after a fall that occurred just PTA. Pt states she was in the kitchen cleaning when she believes her left knee "gave out," causing her to fall forward, striking her head on a wooden table. She notes she saw a bump on the knee after the fall, which she was able to "push back in." She reports associated HA and left knee pain.  Past Medical History  Diagnosis Date  . Abnormal Pap smear     colpo wnl  . Hypertension     currently no meds   Past Surgical History  Procedure Laterality Date  . Hernia repair    . Colposcopy     Family History  Problem Relation Age of Onset  . Anesthesia problems Neg Hx   . Hypertension Mother   . Diabetes Mother   . Hypertension Father   . Diabetes Father    Social History  Substance Use Topics  . Smoking status: Former Smoker -- 0.25 packs/day    Types: Cigarettes    Quit date: 06/11/2011  . Smokeless tobacco: None  . Alcohol Use: No     Comment: ocassional   OB History    Gravida Para Term Preterm AB TAB SAB Ectopic Multiple Living   0 0 0 0 0 0 2     Review of Systems  Musculoskeletal: Positive for arthralgias.  Skin: Positive for wound.  Neurological: Positive for headaches.    Allergies  Review of patient's allergies indicates no known allergies.  Home Medications   Prior to Admission medications   Medication Sig  Start Date End Date Taking? Authorizing Provider  acetaminophen-codeine (TYLENOL #3) 300-30 MG tablet Take 1 tablet by mouth every 4 (four) hours as needed for moderate pain.   Yes Historical Provider, MD  docusate sodium (COLACE) 100 MG capsule Take 100 mg by mouth daily.   Yes Historical Provider, MD  Multiple Vitamin (MULTIVITAMIN WITH MINERALS) TABS tablet Take 1 tablet by mouth daily.   Yes Historical Provider, MD  HYDROcodone-acetaminophen (NORCO/VICODIN) 5-325 MG tablet Take 1-2 tablets by mouth every 6 (six) hours as needed for severe pain. Patient not taking: Reported on 02/24/2015 12/11/14   Danelle Berry, PA-C  ibuprofen (ADVIL,MOTRIN) 800 MG tablet Take 1 tablet (800 mg total) by mouth 3 (three) times daily. 02/24/15   Earley Favor, NP   BP 144/84 mmHg  Pulse 87  Temp(Src) 98.1 F (36.7 C) (Oral)  Resp 16  SpO2 100%  LMP 02/23/2015 (Approximate)  Breastfeeding? No Physical Exam  Constitutional: She is oriented to person, place, and time. She appears well-developed and well-nourished.  HENT:  Head: Normocephalic and atraumatic.  Eyes: Conjunctivae are normal. Right eye exhibits no discharge. Left eye exhibits no discharge.  Pulmonary/Chest: Effort normal. No respiratory distress.  Neurological: She is alert and oriented to person, place, and time. Coordination  normal.  Skin: Skin is warm and dry. No rash noted. She is not diaphoretic. No erythema.  Psychiatric: She has a normal mood and affect.  Nursing note and vitals reviewed.   ED Course  .Marland KitchenLaceration Repair Date/Time: 02/24/2015 9:30 PM Performed by: Earley Favor Authorized by: Earley Favor Consent: Verbal consent obtained. Written consent not obtained. Consent given by: patient Patient understanding: patient states understanding of the procedure being performed Patient identity confirmed: verbally with patient Body area: head/neck Location details: forehead Laceration length: 1.5 cm Foreign bodies: wood Tendon  involvement: none Nerve involvement: none Vascular damage: no Anesthesia: local infiltration Local anesthetic: lidocaine 2% with epinephrine Anesthetic total: 1.5 ml Patient sedated: no Preparation: Patient was prepped and draped in the usual sterile fashion. Irrigation solution: saline Amount of cleaning: standard Debridement: none Degree of undermining: none Skin closure: glue Subcutaneous closure: 5-0 Vicryl Approximation: close Patient tolerance: Patient tolerated the procedure well with no immediate complications    DIAGNOSTIC STUDIES: Oxygen Saturation is 100% on RA, normal by my interpretation.    COORDINATION OF CARE: 8:12 PM - Discussed plans to cleanse, numb, and suture. Pt advised of plan for treatment and pt agrees.  Labs Review Labs Reviewed - No data to display  Imaging Review Dg Knee Complete 4 Views Left  02/24/2015  CLINICAL DATA:  Patient's left knee gave out, causing her to fall forward in strike her head. Patella was dislocated laterally but she pushed it back in. Left knee pain. EXAM: LEFT KNEE - COMPLETE 4+ VIEW COMPARISON:  None. FINDINGS: Mild patella alta. No significant effusion. Left knee appears otherwise intact. No evidence of acute fracture. No focal bone lesion or bone destruction. Bone cortex and trabecular architecture appear intact. No radiopaque soft tissue foreign bodies. IMPRESSION: Mild patella Alta.  No evidence of acute fracture. Electronically Signed   By: Burman Nieves M.D.   On: 02/24/2015 20:44   I have personally reviewed and evaluated these images and lab results as part of my medical decision-making.   EKG Interpretation None     Ace wrapp applied for support of knee as knee sleeve too large for patient instructed in wound care  MDM   Final diagnoses:  Knee dislocation, left, initial encounter  Facial laceration, initial encounter      I personally performed the services described in this documentation, which was scribed  in my presence. The recorded information has been reviewed and is accurate.  Earley Favor, NP 02/24/15 2133  Raeford Razor, MD 03/23/15 2142

## 2015-02-24 NOTE — ED Notes (Addendum)
Pt reports that she was in the kitchen walking when her "knee gave out" causing her to fall forward and hit her head. Pt denies loosing consciousness at any point in time. Pt arrives to ED with 2cm laceration to her forehead, bleeding controlled rating pain 10/10. Pt also reports left knee tenderness. Pt A+OX4, speaking in complete sentences, and ambulatory.

## 2015-03-03 ENCOUNTER — Encounter: Payer: Self-pay | Admitting: Internal Medicine

## 2015-03-03 ENCOUNTER — Ambulatory Visit (INDEPENDENT_AMBULATORY_CARE_PROVIDER_SITE_OTHER): Payer: Self-pay | Admitting: Internal Medicine

## 2015-03-03 VITALS — BP 133/86 | HR 69 | Temp 98.1°F | Ht 59.5 in | Wt 115.7 lb

## 2015-03-03 DIAGNOSIS — Y92009 Unspecified place in unspecified non-institutional (private) residence as the place of occurrence of the external cause: Secondary | ICD-10-CM

## 2015-03-03 DIAGNOSIS — S0181XA Laceration without foreign body of other part of head, initial encounter: Secondary | ICD-10-CM | POA: Insufficient documentation

## 2015-03-03 DIAGNOSIS — S0181XD Laceration without foreign body of other part of head, subsequent encounter: Secondary | ICD-10-CM

## 2015-03-03 DIAGNOSIS — W19XXXD Unspecified fall, subsequent encounter: Secondary | ICD-10-CM

## 2015-03-03 DIAGNOSIS — Z0000 Encounter for general adult medical examination without abnormal findings: Secondary | ICD-10-CM | POA: Insufficient documentation

## 2015-03-03 DIAGNOSIS — Q682 Congenital deformity of knee: Secondary | ICD-10-CM

## 2015-03-03 DIAGNOSIS — R87613 High grade squamous intraepithelial lesion on cytologic smear of cervix (HGSIL): Secondary | ICD-10-CM

## 2015-03-03 NOTE — Progress Notes (Signed)
Medicine attending: Medical history, presenting problems, physical findings, and medications, reviewed with resident physician Dr Eden Jones on the day of the patient visit and I concur with his evaluation and management plan. 

## 2015-03-03 NOTE — Assessment & Plan Note (Signed)
Patient states she had a patellar dislocation at home which precipitated her fall. Says she stepped strangely and her knee popped out. She says she popped it back in. XR of the left knee shows no evidence of acute process, only patella alta.

## 2015-03-03 NOTE — Patient Instructions (Signed)
1. Please return for follow up in 1 year unless you need to be seen sooner.   2. If you have worsening of your symptoms or new symptoms arise, please call the clinic [phone]), or go to the ER immediately if symptoms are severe.

## 2015-03-03 NOTE — Assessment & Plan Note (Addendum)
Declined flu shot. Had pap within the last year with a different provider. Results were negative per patient.

## 2015-03-03 NOTE — Assessment & Plan Note (Signed)
Sustained laceration to center of forehead during fall at home, cut on her counter top. Received sutures, dissolvable. Appear to be healing appropriately. No intervention.

## 2015-03-03 NOTE — Progress Notes (Signed)
   Subjective:   Patient ID: Rhonda Fisher female   DOB: 1980-04-14 34 y.o.   MRN: [id number]  HPI: [name] is a 35 y.o. female w/ PMHx of HTN, presents to the clinic today for a new patient visit and follow up after a fall at home. Patient states that she recently stepped strangely while she was in her kitchen, resulting in a patellar dislocation and a fall where she hit her head on her counter and sustained a central laceration to her forehead. No other significant injuries, no concussion. Today, she is here to establish care in our clinic as she does not have a PCP. Has no significant complaints. BP only mildly elevated, SBP in the 130's. No chest pain, SOB, dizziness, lightheadedness, palpitations, fever, chills, nausea, vomiting, cough, diarrhea, or abdominal pain.   Patient is sexually active with once partner, is a former smoker, no recreational drug use, no alcohol use. Family history listed below.    Past Medical History  Diagnosis Date  . Abnormal Pap smear     colpo wnl  . Hypertension     currently no meds   Family History  Problem Relation Age of Onset  . Anesthesia problems Neg Hx   . Hypertension Mother   . Diabetes Mother   . Hypertension Father   . Diabetes Father     Current Outpatient Prescriptions  Medication Sig Dispense Refill  . acetaminophen-codeine (TYLENOL #3) 300-30 MG tablet Take 1 tablet by mouth every 4 (four) hours as needed for moderate pain.    Marland Kitchen docusate sodium (COLACE) 100 MG capsule Take 100 mg by mouth daily.    Marland Kitchen ibuprofen (ADVIL,MOTRIN) 800 MG tablet Take 1 tablet (800 mg total) by mouth 3 (three) times daily. 30 tablet 0  . Multiple Vitamin (MULTIVITAMIN WITH MINERALS) TABS tablet Take 1 tablet by mouth daily.     No current facility-administered medications for this visit.    Review of Systems: General: Denies fever, chills, diaphoresis, appetite change and fatigue.  Respiratory: Denies SOB, DOE, cough, and wheezing.     Cardiovascular: Denies chest pain and palpitations.  Gastrointestinal: Denies nausea, vomiting, abdominal pain, and diarrhea.  Genitourinary: Denies dysuria, increased frequency, and flank pain. Endocrine: Denies hot or cold intolerance, polyuria, and polydipsia. Musculoskeletal: Denies myalgias, back pain, joint swelling, arthralgias and gait problem.  Skin: Denies pallor, rash and wounds.  Neurological: Denies dizziness, seizures, syncope, weakness, lightheadedness, numbness and headaches.  Psychiatric/Behavioral: Denies mood changes, and sleep disturbances.  Objective:   Physical Exam: Filed Vitals:   03/03/15 0929  BP: 133/86  Pulse: 69  Temp: 98.1 F (36.7 C)  TempSrc: Oral  Height: 4' 11.5" (1.511 m)  Weight: 115 lb 11.2 oz (52.481 kg)  SpO2: 100%    General: Thin, AA female, alert, cooperative, NAD. HEENT: PERRL, EOMI. Moist mucus membranes. Central forehead laceration, appears to be healing well.  Neck: Full range of motion without pain, supple, no lymphadenopathy or carotid bruits Lungs: Clear to ascultation bilaterally, normal work of respiration, no wheezes, rales, rhonchi Heart: RRR, no murmurs, gallops, or rubs Abdomen: Soft, non-tender, non-distended, BS + Extremities: No cyanosis, clubbing, or edema. Left knee with mild tenderness over the medial joint space, otherwise ROM intact. No swelling or erythema.  Neurologic: Alert & oriented X3, cranial nerves II-XII intact, strength grossly intact, sensation intact to light touch   Assessment & Plan:   Please see problem based assessment and plan.

## 2015-03-03 NOTE — Assessment & Plan Note (Signed)
States that she has had a Pap smear within the year and results were negative.

## 2015-03-22 ENCOUNTER — Ambulatory Visit: Payer: Medicaid Other | Admitting: Internal Medicine

## 2015-03-28 ENCOUNTER — Ambulatory Visit: Payer: Medicaid Other | Admitting: Internal Medicine

## 2015-04-03 ENCOUNTER — Telehealth: Payer: Self-pay | Admitting: Internal Medicine

## 2015-04-03 NOTE — Telephone Encounter (Signed)
Patient requesting a referral for her Knee.  Patient states it is no better and it stills hurts to bend it and [facility].   Please advise.

## 2015-04-04 ENCOUNTER — Ambulatory Visit: Payer: Medicaid Other

## 2015-04-05 ENCOUNTER — Encounter: Payer: Self-pay | Admitting: Internal Medicine

## 2015-04-05 ENCOUNTER — Ambulatory Visit (INDEPENDENT_AMBULATORY_CARE_PROVIDER_SITE_OTHER): Payer: Self-pay | Admitting: Internal Medicine

## 2015-04-05 VITALS — BP 132/67 | HR 92 | Temp 98.4°F | Ht 59.5 in | Wt 118.3 lb

## 2015-04-05 DIAGNOSIS — Z862 Personal history of diseases of the blood and blood-forming organs and certain disorders involving the immune mechanism: Secondary | ICD-10-CM

## 2015-04-05 DIAGNOSIS — M25562 Pain in left knee: Secondary | ICD-10-CM

## 2015-04-05 DIAGNOSIS — D649 Anemia, unspecified: Secondary | ICD-10-CM

## 2015-04-05 DIAGNOSIS — R87613 High grade squamous intraepithelial lesion on cytologic smear of cervix (HGSIL): Secondary | ICD-10-CM

## 2015-04-05 NOTE — Progress Notes (Signed)
Patient ID: [place] P Zillmer, female   DOB: 03/08/1980, 35 y.o.   MRN: [id number]   Subjective:   Patient ID: [place] P Grandstaff female   DOB: 10/10/1980 34 y.o.   MRN: [id number]  HPI: Ms.Tinlee P Anselm Lisnoch is a 35 y.o. with no significant PMH presented today to follow up on her knee injury sustained 6 weeks ago.  Please see problem based charting for, details, assessment and plan.  Past Medical History  Diagnosis Date  . Abnormal Pap smear     colpo wnl  . Hypertension     currently no meds   Review of Systems: CONSTITUTIONAL- No Fever, weightloss,  SKIN- No Rash, colour changes or itching. HEAD- No Headache or dizziness. RESPIRATORY- No Cough or SOB. CARDIAC- No Palpitations, or chest pain. GI- No nausea, vomiting, diarrhoea, constipation, abd pain. URINARY- No Frequency, urgency, straining or dysuria.  Objective:  Physical Exam: Filed Vitals:   04/05/15 1543  BP: 132/67  Pulse: 92  Temp: 98.4 F (36.9 C)  TempSrc: Oral  Height: 4' 11.5" (1.511 m)  Weight: 118 lb 4.8 oz (53.661 kg)  SpO2: 100%   GENERAL- alert, co-operative HEENT- Atraumatic, normocephalic, PERRL, CARDIAC- RRR RESP- Moving equal volumes of air,no wheezes or crackles. ABDOMEN- Soft, nontender,  NEURO- Alert and oriented, Gait- Normal. EXTREMITIES- pulse 2+, symmetric, no pedal edema. Left knee- slight soft tissue swelling, obvious only when compared to right, around patella, mild tenderness to palpation, joint lines palpable, restricted range of motion- to extension and flexion, both passive and active.  SKIN- Warm, dry, No rash or lesion. PSYCH- Normal mood and affect, appropriate thought content and speech.  Assessment & Plan:   The patient's case and plan of care was discussed with attending physician, Dr. Rogelia BogaButcher.  Please see problem based charting for assessment and plan.

## 2015-04-05 NOTE — Patient Instructions (Signed)
We will be sending you to a sports medicine doctor. They might be able to do ultrasounds on your knee its either that or we get an MRI of your knee.  It was nice seeing you today.

## 2015-04-06 DIAGNOSIS — D649 Anemia, unspecified: Secondary | ICD-10-CM | POA: Insufficient documentation

## 2015-04-06 DIAGNOSIS — M25562 Pain in left knee: Secondary | ICD-10-CM | POA: Insufficient documentation

## 2015-04-06 LAB — CBC
Hematocrit: 36.9 % (ref 34.0–46.6)
Hemoglobin: 11.8 g/dL (ref 11.1–15.9)
MCH: 30.6 pg (ref 26.6–33.0)
MCHC: 32 g/dL (ref 31.5–35.7)
MCV: 96 fL (ref 79–97)
NRBC: 0 % (ref 0–0)
Platelets: 201 10*3/uL (ref 150–379)
RBC: 3.86 x10E6/uL (ref 3.77–5.28)
RDW: 13 % (ref 12.3–15.4)
WBC: 3.7 10*3/uL (ref 3.4–10.8)

## 2015-04-06 NOTE — Assessment & Plan Note (Signed)
Prior blood work with mild anemia, pt was pregnant at the time, most likely iron deficiency, related to pregnancy. Will repeat CBC, as she is having periods.  Addendum- CBC normal.

## 2015-04-06 NOTE — Assessment & Plan Note (Addendum)
Pt with knee pain, started after she fell, does not know exactly why she fell, was too suddeny she says to know whys but no events since then or prior to that. She was in the ED, had knee xray, negative for fracture, but showed - a rare congential dsd called Patella alta, which is a small patella, since fall- ~6 weeks ago, she has been taking ibuprofen without relief. EXam with limited range of motion, not so much due to pain.  Plan- Concern for ruptured patello-femoral ligament or patella ligament, considering limited range of motion. - Will refer Sports meds, and possibly can do Ultrasound otherwise pt will need an MRI.  - Work note given.

## 2015-04-06 NOTE — Assessment & Plan Note (Addendum)
Pt says she has had normal results pap smears that was negative, was done within the past year, pt to bring results or sign release of information form to get result next visit.

## 2015-04-07 NOTE — Progress Notes (Signed)
Internal Medicine Clinic Attending  Case discussed with Dr. Emokpae soon after the resident saw the patient.  We reviewed the resident's history and exam and pertinent patient test results.  I agree with the assessment, diagnosis, and plan of care documented in the resident's note. 

## 2015-04-12 ENCOUNTER — Telehealth: Payer: Self-pay | Admitting: Internal Medicine

## 2015-04-12 NOTE — Telephone Encounter (Signed)
Pt calling asking for results for bloodwork.

## 2015-04-12 NOTE — Telephone Encounter (Signed)
Called pt back to inform her WBC normal.  LVM

## 2015-04-17 ENCOUNTER — Encounter: Payer: Self-pay | Admitting: Sports Medicine

## 2015-04-17 ENCOUNTER — Ambulatory Visit (INDEPENDENT_AMBULATORY_CARE_PROVIDER_SITE_OTHER): Payer: Self-pay | Admitting: Sports Medicine

## 2015-04-17 ENCOUNTER — Ambulatory Visit
Admission: RE | Admit: 2015-04-17 | Discharge: 2015-04-17 | Disposition: A | Discharge status: Home / Self Care | Payer: No Typology Code available for payment source | Source: Ambulatory Visit | Attending: Sports Medicine | Admitting: Sports Medicine

## 2015-04-17 VITALS — BP 134/85 | HR 89 | Ht 59.5 in | Wt 118.0 lb

## 2015-04-17 DIAGNOSIS — M25562 Pain in left knee: Secondary | ICD-10-CM

## 2015-04-17 DIAGNOSIS — S83005A Unspecified dislocation of left patella, initial encounter: Secondary | ICD-10-CM

## 2015-04-17 NOTE — Progress Notes (Signed)
   Subjective:    Patient ID: Rhonda Fisher, female    DOB: 05/26/1980, 35 y.o.   MRN: [id number]  HPI chief complaint: Left knee pain  Very pleasant 35 year old female comes in today after having injured her left knee 2 months ago. She suffered a twisting injury to the left knee while at home. She suffered an obvious patellar dislocation which she was able to self reduce. She was seen in the emergency room and x-rays were obtained. No fracture was seen. She was given an Ace wrap and discharged. Since that time she has had intermittent pain and swelling in this knee particularly with activity. Occasional popping as well. She has noticed an inability to flex the knee past 90. She denies any problems with this knee prior to his injury. No prior knee surgeries.  Past medical history reviewed Medications reviewed Allergies reviewed    Review of Systems    as above Objective:   Physical Exam  Well-developed, well-nourished. No acute distress. Awake alert and oriented 3. Vital signs reviewed  Left knee: Range of motion is 0-90. Trace effusion. Negative patellar apprehension. Knee is stable to valgus and varus stressing. Negative Lachman's. There is some fullness in the popliteal fossa consistent with a possible Baker's cyst. She is tender to palpation along the lateral joint line. No tenderness along the medial joint line. Neurovascularly intact distally.  X-rays are as above      Assessment & Plan:   Left knee pain and swelling secondary to patellar dislocation  MRI of the left knee specifically to evaluate the integrity of the  MPFL ligament and to rule out an osteochondral loose body. In the meantime, I will place her into a patellar stabilizer brace. She will follow-up with me in the office after the MRI to discuss those results and delineate more definitive treatment.

## 2015-04-19 ENCOUNTER — Ambulatory Visit (INDEPENDENT_AMBULATORY_CARE_PROVIDER_SITE_OTHER): Payer: Self-pay | Admitting: Family Medicine

## 2015-04-19 ENCOUNTER — Encounter: Payer: Self-pay | Admitting: Family Medicine

## 2015-04-19 VITALS — BP 134/70 | HR 85 | Ht 59.5 in | Wt 118.0 lb

## 2015-04-19 DIAGNOSIS — S83005A Unspecified dislocation of left patella, initial encounter: Secondary | ICD-10-CM

## 2015-04-19 NOTE — Progress Notes (Signed)
Patient ID: [place], female   DOB: 01/16/1981, 35 y.o.   MRN: [id number]  Patient comes in today to discuss MRI findings of her left knee. MRI does show a complete tear of the MPFL ligament which is consistent with her previous patellar dislocation. She still has quite a bit of bone bruising in the patella and femur from her injury. She has a shallow trochlear groove. No obvious loose body. No significant chondral damage.  Given these findings, I've recommended a course of conservative treatment with aggressive range of motion and strengthening at home. She will continue with her patellar stabilizer brace when active. Follow-up with me in 4 weeks for reevaluation. If she suffers a second patellar dislocation, then we will need to consider referral to orthopedics to discuss MPFL reconstruction.

## 2015-04-21 NOTE — Telephone Encounter (Signed)
Patient seen and ref to [facility]

## 2015-05-31 ENCOUNTER — Ambulatory Visit: Payer: No Typology Code available for payment source | Admitting: Family Medicine

## 2015-06-14 ENCOUNTER — Encounter: Payer: Self-pay | Admitting: Family Medicine

## 2015-06-14 ENCOUNTER — Ambulatory Visit (INDEPENDENT_AMBULATORY_CARE_PROVIDER_SITE_OTHER): Payer: Self-pay | Admitting: Family Medicine

## 2015-06-14 VITALS — BP 120/80 | Ht 59.5 in | Wt 118.0 lb

## 2015-06-14 DIAGNOSIS — S83005D Unspecified dislocation of left patella, subsequent encounter: Secondary | ICD-10-CM

## 2015-06-14 DIAGNOSIS — S83006A Unspecified dislocation of unspecified patella, initial encounter: Secondary | ICD-10-CM | POA: Insufficient documentation

## 2015-06-14 DIAGNOSIS — Q682 Congenital deformity of knee: Secondary | ICD-10-CM

## 2015-06-14 NOTE — Assessment & Plan Note (Signed)
Patient is doing well with rehabilitation. Her patella alta probably had contributed to the noncontact patellar dislocation. -Patient should continue with Shields brace with activity for the next 2-3 months while working on her activity/VMO strengthening. -If it happens again I would refer to orthopedics for MPFL reconstruction

## 2015-06-14 NOTE — Progress Notes (Signed)
   Subjective:    Patient ID: Rhonda Fisher, female    DOB: 01/25/1981, 35 y.o.   MRN: [id number]  HPI chief complaint: Left knee pain  L knee patellar dislocation - Pt seen two months ago for this.  Previous injury in January 2017. She was seen in the emergency room and x-rays were obtained. No fracture was seen. She was given an Ace wrap and discharged. Since that time she has had intermittent pain and swelling in this knee particularly with activity. Occasional popping as well. She has noticed an inability to flex the knee past 90. She denies any problems with this knee prior to his injury. No prior knee surgeries.  MRI completed in March 2017 showing MPFL tear and bone bruising but no OCD fragments.   she has been doing physical therapy on her own and home exercise programs to help strengthen her quadriceps. This is worked great and denies any recurrent dislocations of her patella  Past medical history reviewed Medications reviewed Allergies reviewed    Review of Systems    as above Objective:   Physical Exam  Well-developed, well-nourished. No acute distress. Awake alert and oriented 3. Vital signs reviewed  Left knee: Range of motion is 0-120. No effusion. Negative patellar apprehension but 50% patellar translation. Knee is stable to valgus and varus stressing. Negative Lachman's. Neurovascularly intact distally.

## 2015-06-21 ENCOUNTER — Encounter: Payer: Self-pay | Admitting: Internal Medicine

## 2015-06-21 ENCOUNTER — Ambulatory Visit (INDEPENDENT_AMBULATORY_CARE_PROVIDER_SITE_OTHER): Payer: No Typology Code available for payment source | Admitting: Internal Medicine

## 2015-06-21 VITALS — BP 126/70 | HR 77 | Temp 98.3°F | Wt 119.5 lb

## 2015-06-21 DIAGNOSIS — N898 Other specified noninflammatory disorders of vagina: Secondary | ICD-10-CM

## 2015-06-21 LAB — POCT URINE PREGNANCY: Preg Test, Ur: NEGATIVE

## 2015-06-21 NOTE — Assessment & Plan Note (Addendum)
Pt p/w vaginal discharge going on for 1 wk.  Denies any itching, burning, urinary symptoms, or odor.  No labial lesions.  States the discharge is slightly yellow and watery without blood.  LMP was 06/03/15.  Does report a h/o gonorrhea in the past for which she was treated.  Has an IUD in place for about 1 yr.  No fever/chills, vomiting, abdominal pain.  Is sexually active with 1 person.  Partner has not had any symptoms that she is aware of.  [facility] [address] about 1 yr ago and was told this was normal.  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  Wet prep obtained with Purnell ShoemakerKaye present as my chaperon.  -wet prep, GC/chlamydia, urine preg -will inform her if results are abnormal  -[facility] [address]

## 2015-06-21 NOTE — Progress Notes (Signed)
Patient ID: Rhonda Fisher, female   DOB: 01/01/1981, 35 y.o.   MRN: [id number]     Subjective:   Patient ID: Rhonda Fisher female    DOB: 02/28/1980 35 y.o.    MRN: [id number] Health Maintenance Due: Health Maintenance Due  Topic Date Due  . PAP SMEAR  05/01/2003    _________________________________________________  HPI: Ms.Rhonda Fisher is a 35 y.o. female here for vaginal discharge.  Pt has a PMH outlined below.  Please see problem-based charting assessment and plan for further status of patient's chronic medical problems addressed at today's visit.  PMH: Past Medical History  Diagnosis Date  . Abnormal Pap smear     colpo wnl  . Hypertension     currently no meds    Medications: Current Outpatient Prescriptions on File Prior to Visit  Medication Sig Dispense Refill  . acetaminophen-codeine (TYLENOL #3) 300-30 MG tablet Take 1 tablet by mouth every 4 (four) hours as needed for moderate pain.    Marland Kitchen. docusate sodium (COLACE) 100 MG capsule Take 100 mg by mouth daily.    Marland Kitchen. ibuprofen (ADVIL,MOTRIN) 800 MG tablet Take 1 tablet (800 mg total) by mouth 3 (three) times daily. 30 tablet 0  . Multiple Vitamin (MULTIVITAMIN WITH MINERALS) TABS tablet Take 1 tablet by mouth daily.     No current facility-administered medications on file prior to visit.    Allergies: No Known Allergies  FH: Family History  Problem Relation Age of Onset  . Anesthesia problems Neg Hx   . Hypertension Mother   . Diabetes Mother   . Hypertension Father   . Diabetes Father     SH: Social History   Social History  . Marital Status: Single    Spouse Name: N/A  . Number of Children: N/A  . Years of Education: N/A   Social History Main Topics  . Smoking status: Former Smoker -- 0.25 packs/day    Types: Cigarettes    Quit date: 06/11/2011  . Smokeless tobacco: Not on file  . Alcohol Use: No     Comment: ocassional  . Drug Use: No  . Sexual Activity: Yes    Birth Control/ Protection: None    Other Topics Concern  . Not on file   Social History Narrative    Review of Systems: Constitutional: Negative for fever, chills.  Eyes: Negative for blurred vision.  Respiratory: Negative for cough and shortness of breath.  Cardiovascular: Negative for chest pain.  Gastrointestinal: Negative for nausea, vomiting. Neurological: Negative for dizziness.   Objective:   Vital Signs: There were no vitals filed for this visit.    BP Readings from Last 3 Encounters:  06/14/15 120/80  04/19/15 134/70  04/17/15 134/85    Physical Exam: Constitutional: Vital signs reviewed.  Patient is in NAD and cooperative with exam.  Head: Normocephalic and atraumatic. Eyes: EOMI, conjunctivae nl, no scleral icterus.  Pulmonary/Chest: Normal effort.  Neurological: A&O x3, cranial nerves II-XII are grossly intact, moving all extremities. Genitourinary: Normal external genitalia without lesions.  Vaginal canal,  normal pink mucosa without lesions.  There is a large amt of creamy, slight yellow discharge covering the vaginal walls.   Skin: Warm, dry and intact.    Assessment & Plan:   Assessment and plan was discussed and formulated with my attending.

## 2015-06-21 NOTE — Patient Instructions (Signed)
Thank you for your visit today.  We will let you know only if your results are abnormal.  You will not hear from us if all tests are normal.    Please be sure to bring all of your medications with you to every visit; this includes herbal supplements, vitamins, eye drops, and any over-the-counter medications.   Should you have any questions regarding your medications and/or any new or worsening symptoms, please be sure to call the clinic at [phone].   If you believe that you are suffering from a life threatening condition or one that may result in the loss of limb or function, then you should call 911 and proceed to the nearest Emergency Department.   A healthy lifestyle and preventative care can promote health and wellness.   Maintain regular health, dental, and eye exams.  Eat a healthy diet. Foods like vegetables, fruits, whole grains, low-fat dairy products, and lean protein foods contain the nutrients you need without too many calories. Decrease your intake of foods high in solid fats, added sugars, and salt. Get information about a proper diet from your caregiver, if necessary.  Regular physical exercise is one of the most important things you can do for your health. Most adults should get at least 150 minutes of moderate-intensity exercise (any activity that increases your heart rate and causes you to sweat) each week. In addition, most adults need muscle-strengthening exercises on 2 or more days a week.   Maintain a healthy weight. The body mass index (BMI) is a screening tool to identify possible weight problems. It provides an estimate of body fat based on height and weight. Your caregiver can help determine your BMI, and can help you achieve or maintain a healthy weight. For adults 20 years and older:  A BMI below 18.5 is considered underweight.  A BMI of 18.5 to 24.9 is normal.  A BMI of 25 to 29.9 is considered overweight.  A BMI of 30 and above is considered  obese.

## 2015-06-22 LAB — CERVICOVAGINAL ANCILLARY ONLY
Chlamydia: NEGATIVE
Neisseria Gonorrhea: NEGATIVE

## 2015-06-23 ENCOUNTER — Other Ambulatory Visit: Payer: Self-pay | Admitting: Internal Medicine

## 2015-06-23 LAB — CERVICOVAGINAL ANCILLARY ONLY: Wet Prep (BD Affirm): POSITIVE — AB

## 2015-06-23 MED ORDER — METRONIDAZOLE 500 MG PO TABS: 500.0000 mg | ORAL_TABLET | Freq: Two times a day (BID) | ORAL | Status: DC

## 2015-06-23 NOTE — Progress Notes (Signed)
Internal Medicine Clinic Attending  Case discussed with Dr. Gill soon after the resident saw the patient.  We reviewed the resident's history and exam and pertinent patient test results.  I agree with the assessment, diagnosis, and plan of care documented in the resident's note.  

## 2015-06-27 ENCOUNTER — Telehealth: Payer: Self-pay | Admitting: *Deleted

## 2015-06-27 NOTE — Telephone Encounter (Signed)
Call made to pt to make her aware of wet prep results and new rx sent to pharmacy-no answer, HIPPA compliant message left on pts' recorder.Kingsley SpittleGoldston, Darlene Cassady5/30/201712:44 PM

## 2015-06-27 NOTE — Telephone Encounter (Signed)
-----   Message from Marrian SalvageJacquelyn S Gill, MD sent at 06/23/2015  3:53 PM EDT ----- Positive for BV, will send metronidazole to her pharmacy.  Purnell ShoemakerKaye, will you please call and let her that she is positive for BV and that I will send a prescription to her pharmacy.  Thanks.

## 2015-06-28 NOTE — Telephone Encounter (Signed)
Spoke with patient and informed her of wet prep and prescription.  [facility] per pt's request-phone call complete.Criss AlvineGoldston, Kelsa Jaworowski Cassady5/31/201710:12 AM

## 2015-10-21 LAB — HM PAP SMEAR

## 2015-10-24 ENCOUNTER — Telehealth: Payer: Self-pay | Admitting: Internal Medicine

## 2015-10-24 NOTE — Telephone Encounter (Signed)
REMINDER CALL IT IS TIME TO RENEW GCCN CARD, LMTCB °

## 2015-11-01 ENCOUNTER — Ambulatory Visit: Payer: Self-pay

## 2015-12-20 ENCOUNTER — Ambulatory Visit: Payer: Self-pay

## 2015-12-25 ENCOUNTER — Telehealth: Payer: Self-pay | Admitting: Internal Medicine

## 2015-12-25 NOTE — Telephone Encounter (Signed)
APT. REMINDER CALL, LMTCB °

## 2015-12-26 ENCOUNTER — Ambulatory Visit: Payer: Self-pay

## 2016-02-07 ENCOUNTER — Emergency Department (HOSPITAL_COMMUNITY)
Admission: EM | Admit: 2016-02-07 | Discharge: 2016-02-07 | Disposition: A | Discharge status: Home / Self Care | Payer: Medicaid Other | Attending: Emergency Medicine | Admitting: Emergency Medicine

## 2016-02-07 ENCOUNTER — Encounter (HOSPITAL_COMMUNITY): Payer: Self-pay | Admitting: Emergency Medicine

## 2016-02-07 DIAGNOSIS — Z87891 Personal history of nicotine dependence: Secondary | ICD-10-CM | POA: Insufficient documentation

## 2016-02-07 DIAGNOSIS — B9789 Other viral agents as the cause of diseases classified elsewhere: Secondary | ICD-10-CM

## 2016-02-07 DIAGNOSIS — J069 Acute upper respiratory infection, unspecified: Secondary | ICD-10-CM | POA: Insufficient documentation

## 2016-02-07 DIAGNOSIS — I10 Essential (primary) hypertension: Secondary | ICD-10-CM | POA: Insufficient documentation

## 2016-02-07 DIAGNOSIS — Z79899 Other long term (current) drug therapy: Secondary | ICD-10-CM | POA: Insufficient documentation

## 2016-02-07 MED ORDER — BENZONATATE 100 MG PO CAPS: 100.0000 mg | ORAL_CAPSULE | Freq: Three times a day (TID) | ORAL | 0 refills | Status: DC

## 2016-02-07 NOTE — ED Provider Notes (Signed)
[facility]-EMERGENCY DEPT Provider Note   CSN: [id number] Arrival date & time: 02/07/16  1522  By signing my name below, I, Teofilo Pod, attest that this documentation has been prepared under the direction and in the presence of Derwood Kaplan, MD . Electronically Signed: Teofilo Pod, ED Scribe. 02/07/2016. 5:51 PM.   History   Chief Complaint Chief Complaint  Patient presents with  . Cough    The history is provided by the patient. No language interpreter was used.   HPI Comments:  Rhonda Fisher is a 36 y.o. female who presents to the Emergency Department complaining of a persistent, dry cough x 1 week. Pt complains of associated chest tightness, rhinorrhea, shoulder pain. Pt states that her chest tightness is exacerbated when coughing. Pt reports sick contact, her kids were recently sick. Pt denies hx of asthma/COPD, and notes that she did not get a flu shot this year. No alleviating factors noted.  Pt denies wheezing, fever.   Past Medical History:  Diagnosis Date  . Abnormal Pap smear    colpo wnl  . Hypertension    currently no meds  . Hypertension     Patient Active Problem List   Diagnosis Date Noted  . Vaginal discharge 06/21/2015  . Closed patellar dislocation 06/14/2015  . Left knee pain 04/06/2015  . Anemia 04/06/2015  . Healthcare maintenance 03/03/2015  . Forehead laceration 03/03/2015  . 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 History of ventral hernia repair 04/21/2011    Past Surgical History:  Procedure Laterality Date  . COLPOSCOPY    . HERNIA REPAIR      OB History    Gravida Para Term Preterm AB Living   2 2 2  0 0 2   SAB TAB Ectopic Multiple Live Births   0 0 0 0 2       Home Medications    Prior to Admission medications   Medication Sig Start Date End Date Taking? Authorizing Provider  acetaminophen-codeine (TYLENOL #3) 300-30 MG tablet Take 1 tablet by mouth every 4 (four) hours as needed for moderate pain.    Historical Provider, MD    benzonatate (TESSALON) 100 MG capsule Take 1 capsule (100 mg total) by mouth every 8 (eight) hours. 02/07/16   Derwood Kaplan, MD  docusate sodium (COLACE) 100 MG capsule Take 100 mg by mouth daily.    Historical Provider, MD  ibuprofen (ADVIL,MOTRIN) 800 MG tablet Take 1 tablet (800 mg total) by mouth 3 (three) times daily. 02/24/15   Earley Favor, NP  metroNIDAZOLE (FLAGYL) 500 MG tablet Take 1 tablet (500 mg total) by mouth 2 (two) times daily. 06/23/15   Marrian Salvage, MD  Multiple Vitamin (MULTIVITAMIN WITH MINERALS) TABS tablet Take 1 tablet by mouth daily.    Historical Provider, MD    Family History Family History  Problem Relation Age of Onset  . Hypertension Mother   . Diabetes Mother   . Hypertension Father   . Diabetes Father   . Anesthesia problems Neg Hx     Social History Social History  Substance Use Topics  . Smoking status: Former Smoker    Packs/day: 0.25    Types: Cigarettes    Quit date: 06/11/2011  . Smokeless tobacco: Not on file  . Alcohol use No     Comment: ocassional     Allergies   Patient has no known allergies.   Review of Systems Review of Systems  Constitutional: Negative for fever.  HENT: Positive for rhinorrhea.  Respiratory: Positive for cough and chest tightness. Negative for wheezing.   Musculoskeletal: Positive for myalgias.  All other systems reviewed and are negative.    Physical Exam Updated Vital Signs BP 134/79 (BP Location: Right Arm)   Pulse 81   Temp 99.5 F (37.5 C) (Oral)   Resp 14   SpO2 100%   Physical Exam  Constitutional: She appears well-developed and well-nourished. No distress.  HENT:  Head: Normocephalic and atraumatic.  Eyes: Conjunctivae are normal.  Cardiovascular: Normal rate and regular rhythm.   Pulmonary/Chest: Effort normal and breath sounds normal.  Lungs clear to auscultation  Abdominal: She exhibits no distension.  Lymphadenopathy:    She has cervical adenopathy (Anterior, bilateral).   Neurological: She is alert.  Skin: Skin is warm and dry.  Psychiatric: She has a normal mood and affect.  Nursing note and vitals reviewed.    ED Treatments / Results  DIAGNOSTIC STUDIES:  Oxygen Saturation is 100% on RA, normal by my interpretation.    COORDINATION OF CARE:  5:51 PM Discussed treatment plan with pt at bedside and pt agreed to plan.   Labs (all labs ordered are listed, but only abnormal results are displayed) Labs Reviewed - No data to display  EKG  EKG Interpretation None       Radiology No results found.  Procedures Procedures (including critical care time)  Medications Ordered in ED Medications - No data to display   Initial Impression / Assessment and Plan / ED Course  I have reviewed the triage vital signs and the nursing notes.  Pertinent labs & imaging results that were available during my care of the patient were reviewed by me and considered in my medical decision making (see chart for details).  Clinical Course      Final Clinical Impressions(s) / ED Diagnoses   Final diagnoses:  Viral URI with cough   DDX includes: Viral syndrome Influenza Pharyngitis Sinusitis  Pt with cc of cough - dry and some chest pain. Pt has nasal congestion/runny nose, malaise, body aches-  And her lungsd are clear and the  Vitals are normal, so I suspect that she is having a viral uri. New Prescriptions New Prescriptions   BENZONATATE (TESSALON) 100 MG CAPSULE    Take 1 capsule (100 mg total) by mouth every 8 (eight) hours.     Derwood KaplanAnkit Araiyah Cumpton, MD 02/07/16 1806

## 2016-02-07 NOTE — ED Notes (Signed)
Declined W/C at D/C and was escorted to lobby by RN. 

## 2016-02-07 NOTE — ED Triage Notes (Signed)
Pt called x3 in unable to locate Pt in front lobby and sub waiting.

## 2016-02-07 NOTE — Discharge Instructions (Signed)
°  We think what you have is a viral syndrome - the treatment for which is symptomatic relief only, and your body will fight the infection off in a few days.  See your primary care doctor in 1 week if the symptoms dont improve.

## 2016-02-07 NOTE — ED Triage Notes (Signed)
Patient reports cough and chest tightness x 1 week.  Patient reports non-productive cough, with no fever.  She does report decreased appetite, no meds PTA.

## 2016-02-24 ENCOUNTER — Emergency Department (HOSPITAL_COMMUNITY): Payer: Self-pay

## 2016-02-24 ENCOUNTER — Emergency Department (HOSPITAL_COMMUNITY)
Admission: EM | Admit: 2016-02-24 | Discharge: 2016-02-24 | Disposition: A | Discharge status: Home / Self Care | Payer: Self-pay | Attending: Emergency Medicine | Admitting: Emergency Medicine

## 2016-02-24 ENCOUNTER — Encounter (HOSPITAL_COMMUNITY): Payer: Self-pay | Admitting: Emergency Medicine

## 2016-02-24 DIAGNOSIS — Z79899 Other long term (current) drug therapy: Secondary | ICD-10-CM | POA: Insufficient documentation

## 2016-02-24 DIAGNOSIS — Z87891 Personal history of nicotine dependence: Secondary | ICD-10-CM | POA: Insufficient documentation

## 2016-02-24 DIAGNOSIS — J069 Acute upper respiratory infection, unspecified: Secondary | ICD-10-CM | POA: Insufficient documentation

## 2016-02-24 DIAGNOSIS — B9789 Other viral agents as the cause of diseases classified elsewhere: Secondary | ICD-10-CM

## 2016-02-24 DIAGNOSIS — I10 Essential (primary) hypertension: Secondary | ICD-10-CM | POA: Insufficient documentation

## 2016-02-24 NOTE — ED Triage Notes (Signed)
Pt reports cough and some associated chest pain began about 2 weeks ago.  She treated it at home with hot tea and honey.  Then her throat began to be sore.  Throat is still sore today.  No fever but has had some chills.  Pt did take Tylenol today at about 1000

## 2016-02-24 NOTE — ED Provider Notes (Signed)
[facility]-EMERGENCY DEPT Provider Note   CSN: [id number] Arrival date & time: 02/24/16  1323   By signing my name below, I, Clarisse Gouge, attest that this documentation has been prepared under the direction and in the presence of Mathews Robinsons, PA-C. Electronically Signed: Clarisse Gouge, Scribe. 02/24/16. 5:50 PM.   History   Chief Complaint Chief Complaint  Patient presents with  . Sore Throat   The history is provided by the patient and medical records. No language interpreter was used.    HPI Comments: Rhonda Fisher is a 36 y.o. female who presents to the Emergency Department complaining of persistent, intermittent productive cough x ~2 weeks. She reports associated sore throat x ~1 week secondary to cough, body aches and chest tightness x ~2 weeks. Pt states she has taken honey, tea, lemon, decongestant, tylenol, theraflu and applied vick's with temporary relief. Pt last took tylenol this morning to relieve body aches. She denies N/V/D, SOB, apnea, fever/ chills.  Past Medical History:  Diagnosis Date  . Abnormal Pap smear    colpo wnl  . Hypertension    currently no meds  . Hypertension     Patient Active Problem List   Diagnosis Date Noted  . Vaginal discharge 06/21/2015  . Closed patellar dislocation 06/14/2015  . Left knee pain 04/06/2015  . Anemia 04/06/2015  . Healthcare maintenance 03/03/2015  . Forehead laceration 03/03/2015  . 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 History of ventral hernia repair 04/21/2011    Past Surgical History:  Procedure Laterality Date  . COLPOSCOPY    . HERNIA REPAIR      OB History    Gravida Para Term Preterm AB Living   2 2 2  0 0 2   SAB TAB Ectopic Multiple Live Births   0 0 0 0 2       Home Medications    Prior to Admission medications   Medication Sig Start Date End Date Taking? Authorizing Provider  acetaminophen-codeine (TYLENOL #3) 300-30 MG tablet Take 1 tablet by mouth every 4 (four) hours as needed for moderate pain.     Historical Provider, MD  benzonatate (TESSALON) 100 MG capsule Take 1 capsule (100 mg total) by mouth every 8 (eight) hours. 02/07/16   Derwood Kaplan, MD  docusate sodium (COLACE) 100 MG capsule Take 100 mg by mouth daily.    Historical Provider, MD  ibuprofen (ADVIL,MOTRIN) 800 MG tablet Take 1 tablet (800 mg total) by mouth 3 (three) times daily. 02/24/15   Earley Favor, NP  metroNIDAZOLE (FLAGYL) 500 MG tablet Take 1 tablet (500 mg total) by mouth 2 (two) times daily. 06/23/15   Marrian Salvage, MD  Multiple Vitamin (MULTIVITAMIN WITH MINERALS) TABS tablet Take 1 tablet by mouth daily.    Historical Provider, MD    Family History Family History  Problem Relation Age of Onset  . Hypertension Mother   . Diabetes Mother   . Hypertension Father   . Diabetes Father   . Anesthesia problems Neg Hx     Social History Social History  Substance Use Topics  . Smoking status: Former Smoker    Packs/day: 0.25    Types: Cigarettes    Quit date: 06/11/2011  . Smokeless tobacco: Not on file  . Alcohol use No     Comment: ocassional     Allergies   Patient has no known allergies.   Review of Systems Review of Systems  Constitutional: Negative for chills and fever.  Respiratory: Negative for shortness of  breath.   Cardiovascular: Negative for chest pain.  Gastrointestinal: Negative for abdominal pain, constipation, diarrhea, nausea and vomiting.  Genitourinary: Negative for dysuria, hematuria, vaginal bleeding and vaginal discharge.  Musculoskeletal: Negative for arthralgias and myalgias.  Skin: Negative for color change.  Allergic/Immunologic: Negative for immunocompromised state.  Neurological: Negative for weakness and numbness.  Psychiatric/Behavioral: Negative for confusion.     Physical Exam Updated Vital Signs BP 131/82 (BP Location: Right Arm)   Pulse 89   Temp 98.3 F (36.8 C) (Oral)   Resp 18   Ht 4\' 11"  (1.499 m)   Wt 125 lb (56.7 kg)   LMP 02/24/2016   SpO2 94%    BMI 25.25 kg/m   Physical Exam  Constitutional: She is oriented to person, place, and time. Vital signs are normal. She appears well-developed and well-nourished.  Non-toxic appearance. No distress.  Afebrile, nontoxic, NAD  HENT:  Head: Normocephalic and atraumatic.  Mouth/Throat: Mucous membranes are normal.  Eyes: Conjunctivae and EOM are normal. Right eye exhibits no discharge. Left eye exhibits no discharge.  Neck: Normal range of motion. Neck supple.  Cardiovascular: Normal rate, regular rhythm and intact distal pulses.   No murmur heard. Pulmonary/Chest: Effort normal. No respiratory distress. She has decreased breath sounds in the right upper field, the right middle field and the right lower field.  Abdominal: Soft. Normal appearance. She exhibits no distension. There is no tenderness. There is no guarding.  Musculoskeletal: Normal range of motion.  Lymphadenopathy:    She has no cervical adenopathy.  Neurological: She is alert and oriented to person, place, and time. She has normal strength. No sensory deficit.  Skin: Skin is warm, dry and intact. No rash noted.  Psychiatric: She has a normal mood and affect. Her behavior is normal.  Nursing note and vitals reviewed.    ED Treatments / Results  DIAGNOSTIC STUDIES: Oxygen Saturation is 94% on RA, adequate by my interpretation.    COORDINATION OF CARE: 5:40 PM Discussed treatment plan with pt at bedside and pt agreed to plan. Will order CXR. Pt advised to treat symptoms at home with OTC medications.  Labs (all labs ordered are listed, but only abnormal results are displayed) Labs Reviewed - No data to display  EKG  EKG Interpretation  Date/Time:  Saturday February 24 2016 14:00:37 EST Ventricular Rate:  89 PR Interval:    QRS Duration: 93 QT Interval:  341 QTC Calculation: 415 R Axis:   78 Text Interpretation:  Age not entered, assumed to be  36 years old for purpose of ECG interpretation Sinus rhythm Short PR  interval Borderline repolarization abnormality Confirmed by ZAMMIT  MD, JOSEPH [id number]) on 02/24/2016 9:03:11 PM       Radiology Dg Chest 2 View  Result Date: 02/24/2016 CLINICAL DATA:  Cough and associated chest pain. EXAM: CHEST  2 VIEW COMPARISON:  None. FINDINGS: The heart size and mediastinal contours are within normal limits. Both lungs are clear. The visualized skeletal structures are unremarkable. IMPRESSION: No active cardiopulmonary disease. Electronically Signed   By: Ted Mcalpine M.D.   On: 02/24/2016 18:05    Procedures Procedures (including critical care time)  Medications Ordered in ED Medications - No data to display   Initial Impression / Assessment and Plan / ED Course  I have reviewed the triage vital signs and the nursing notes.  Pertinent labs & imaging results that were available during my care of the patient were reviewed by me and considered in my medical decision  making (see chart for details).      Otherwise healthy 36 y/o presenting with URI symptoms, cough and sore throat. Lungs were clear with slight decrease in air exchange auscultated on the right. May be due to poor efforts and position of patient during exam. CXR negative for acute cardiopulmonary disease. Exam otherwise unremarkable. Patient is well-appearing, afebrile, non-toxic and in no distress.  Discharge home with symptomatic relief and close PCP follow up. Patient was provided with resources to establish care with PCP in the area.  Discussed strict return precautions. Patient was advised to return to the emergency department if experiencing any new or worsening symptoms. She understood instructions and agreed with discharge plan.  Reviewed EKG with Dr. Estell HarpinZammit who recommends follow up with PCP. Discussed EKG findings with patient and advised to have PCP follow up for further referral if deemed necessary.  I personally performed the services described in this documentation, which was  scribed in my presence. The recorded information has been reviewed and is accurate.   Final Clinical Impressions(s) / ED Diagnoses   Final diagnoses:  Viral URI with cough    New Prescriptions Discharge Medication List as of 02/24/2016  9:06 PM       Georgiana ShoreJessica B Corry Storie, PA-C 02/25/16 0123    Bethann BerkshireJoseph Zammit, MD 02/25/16 2238

## 2016-04-10 ENCOUNTER — Telehealth: Payer: Self-pay | Admitting: Internal Medicine

## 2016-04-10 NOTE — Telephone Encounter (Signed)
APT. REMINDER CALL, LMTCB °

## 2016-04-11 ENCOUNTER — Ambulatory Visit (INDEPENDENT_AMBULATORY_CARE_PROVIDER_SITE_OTHER): Payer: Self-pay | Admitting: Internal Medicine

## 2016-04-11 ENCOUNTER — Other Ambulatory Visit (HOSPITAL_COMMUNITY)
Admission: RE | Admit: 2016-04-11 | Discharge: 2016-04-11 | Disposition: A | Discharge status: Home / Self Care | Payer: Medicaid Other | Source: Ambulatory Visit | Attending: Internal Medicine | Admitting: Internal Medicine

## 2016-04-11 ENCOUNTER — Encounter: Payer: Self-pay | Admitting: Internal Medicine

## 2016-04-11 ENCOUNTER — Ambulatory Visit (HOSPITAL_COMMUNITY): Payer: Medicaid Other

## 2016-04-11 ENCOUNTER — Ambulatory Visit (HOSPITAL_COMMUNITY)
Admission: RE | Admit: 2016-04-11 | Discharge: 2016-04-11 | Disposition: A | Discharge status: Home / Self Care | Payer: Medicaid Other | Source: Ambulatory Visit | Attending: Internal Medicine | Admitting: Internal Medicine

## 2016-04-11 VITALS — BP 139/71 | HR 73 | Temp 99.1°F | Ht 59.5 in | Wt 121.9 lb

## 2016-04-11 DIAGNOSIS — N898 Other specified noninflammatory disorders of vagina: Secondary | ICD-10-CM

## 2016-04-11 DIAGNOSIS — R9431 Abnormal electrocardiogram [ECG] [EKG]: Secondary | ICD-10-CM

## 2016-04-11 DIAGNOSIS — Z01411 Encounter for gynecological examination (general) (routine) with abnormal findings: Secondary | ICD-10-CM

## 2016-04-11 DIAGNOSIS — Z1151 Encounter for screening for human papillomavirus (HPV): Secondary | ICD-10-CM | POA: Insufficient documentation

## 2016-04-11 DIAGNOSIS — Z5189 Encounter for other specified aftercare: Secondary | ICD-10-CM

## 2016-04-11 DIAGNOSIS — L309 Dermatitis, unspecified: Secondary | ICD-10-CM | POA: Insufficient documentation

## 2016-04-11 DIAGNOSIS — Z0000 Encounter for general adult medical examination without abnormal findings: Secondary | ICD-10-CM | POA: Insufficient documentation

## 2016-04-11 DIAGNOSIS — Z113 Encounter for screening for infections with a predominantly sexual mode of transmission: Secondary | ICD-10-CM | POA: Insufficient documentation

## 2016-04-11 MED ORDER — TRIAMCINOLONE 0.1 % CREAM:EUCERIN CREAM 1:1: 1.0000 "application " | TOPICAL_CREAM | Freq: Two times a day (BID) | CUTANEOUS | 0 refills | Status: AC

## 2016-04-11 NOTE — Addendum Note (Signed)
Addended by: Remus BlakeBARROW, Eun Vermeer K on: 04/11/2016 04:26 PM   Modules accepted: Orders

## 2016-04-11 NOTE — Progress Notes (Signed)
   CC: Follow-up from emergency department visit and rash HPI: [name] is a 36 y.o. female with a h/o of hypertension who presents with as a follow-up from an emergency department visit where she was told to follow-up with her primary care physician and for a Pap smear. She also complains of a rash that has been present on her buttock for the past 3 years. She denies chest pain or shortness of breath. She denies nausea, vomiting or abdominal pain. She denies polyuria or dysuria. She has no additional acute complaints today.   Review of Systems: ROS as above.  Physical Exam: There were no vitals filed for this visit. General appearance: alert and cooperative Lungs: clear to auscultation bilaterally Heart: regular rate and rhythm, S1, S2 normal, no murmur, click, rub or gallop Abdomen: soft, non-tender; bowel sounds normal; no masses,  no organomegaly Pelvic: No obvious lesions in the vaginal canal, copious greenish discharge from the cervix along with minimal bloody discharge from the cervix. Extremities: extremities normal, atraumatic, no cyanosis or edema  Assessment & Plan:  See encounters tab for problem based medical decision making. Patient discussed with Dr. Heide SparkNarendra  Signed: Thomasene LotJames Xhaiden Coombs, MD 04/11/2016, 2:36 PM  Pager: [phone]

## 2016-04-11 NOTE — Patient Instructions (Signed)
It was a pleasure seeing you today. Thank you for choosing [facility] for your healthcare needs.   I have prescribed a cream called triamcinolone. You're to apply this to the affected rash 2 times daily for 2 weeks. If you do not get improvement please call the clinic and schedule a follow-up appointment.  Today we did a Pap smear and will also do blood work. I will call you with the results of the studies when they are available.  If you develop chest pain, pressure, tightness, shortness of breath or palpitations please call the clinic immediately or go to the emergency department for further evaluation.  Please return to clinic in 3 months.

## 2016-04-11 NOTE — Assessment & Plan Note (Signed)
For routine healthcare maintenance the patient had a Pap smear today. Inspection of the labia revealed no lesions. There were no obvious lesions or masses within the vaginal canal. There was copious greenish vaginal discharge throughout the vagina and approximating the cervical os. Scant blood was also appreciated. We will send for routine lab work and treat for infection as necessary. -- GC chlamydia -- We will follow-up with results

## 2016-04-11 NOTE — Assessment & Plan Note (Addendum)
Patient was recently evaluated in the emergency department for pharyngitis. At this visit she had an EKG and was told this was abnormal and to follow-up with her primary care physician. Review of the EKG shows shortened PR interval. There is no peaked T waves. QRS does appear enlarged which could be consistent with ventricular hypertrophy. On detailed review of systems with a focus on cardiovascular signs and symptoms she adamantly denies chest pain or shortness of breath. She denies palpitations. She denies feeling as if her heart races. She denies feeling as if her heart is skipping a beat. She has no symptoms or signs related to this issue. Repeat EKG in the office today again demonstrates a shortened PR interval. No delta waves were appreciated that would be suspicious for Wolff-Parkinson-White. She is currently entirely asymptomatic. For now we will proceed with laboratory evaluation and scheduled the patient to return to clinic in 3 months for ongoing assessment and repeat EKG. -- EKG- repeat in 3 months -- CBC -- BMP -- I discussed with the patient that if she does develop shortness of breath, palpitations, chest pain or pressure to call the clinic or go immediately to the emergency department for further evaluation. She endorsed understanding of this plan.

## 2016-04-11 NOTE — Assessment & Plan Note (Addendum)
The patient endorses a 3 year history of a dry itchy rash located in the superior aspect of her buttocks bilaterally. She first noticed this during pregnancy with her son a little more than 2 years ago. She states this is itchy. She has tried a cream without much relief. It is not painful. There is no discharge or bleeding lesions. On physical examination there is hyperpigmentation of the area between her buttock bilaterally. There is a single approximately 0.5 cm macule located on her left buttock. This is itchy. No erythema. No evidence of cellulitis or infection. This most likely represents a dermatitis and we'll treat with triamcinolone cream. I discussed with her that if this does not improve to discuss this at her next clinic visit in 3 months. -- Triamcinolone

## 2016-04-11 NOTE — Assessment & Plan Note (Signed)
Patient had Pap smear today. We'll also send for pathology and high risk HPV testing. Examination revealed no lesions or masses. There was copious greenish discharge throughout the vagina and cervical os. Minimal blood at the cervical os was also appreciated. -- Follow-up results of Pap smear

## 2016-04-12 LAB — BMP8+ANION GAP
Anion Gap: 15 mmol/L (ref 10.0–18.0)
BUN/Creatinine Ratio: 19 (ref 9–23)
BUN: 12 mg/dL (ref 6–20)
CO2: 23 mmol/L (ref 18–29)
Calcium: 9.1 mg/dL (ref 8.7–10.2)
Chloride: 101 mmol/L (ref 96–106)
Creatinine, Ser: 0.63 mg/dL (ref 0.57–1.00)
GFR calc Af Amer: 136 mL/min/{1.73_m2} (ref >59)
GFR calc non Af Amer: 118 mL/min/{1.73_m2} (ref >59)
Glucose: 74 mg/dL (ref 65–99)
Potassium: 3.6 mmol/L (ref 3.5–5.2)
Sodium: 139 mmol/L (ref 134–144)

## 2016-04-12 LAB — CBC
Hematocrit: 38.9 % (ref 34.0–46.6)
Hemoglobin: 13.1 g/dL (ref 11.1–15.9)
MCH: 30.9 pg (ref 26.6–33.0)
MCHC: 33.7 g/dL (ref 31.5–35.7)
MCV: 92 fL (ref 79–97)
Platelets: 210 10*3/uL (ref 150–379)
RBC: 4.24 x10E6/uL (ref 3.77–5.28)
RDW: 12.9 % (ref 12.3–15.4)
WBC: 4.4 10*3/uL (ref 3.4–10.8)

## 2016-04-13 LAB — CERVICOVAGINAL ANCILLARY ONLY
Chlamydia: NEGATIVE
Neisseria Gonorrhea: NEGATIVE

## 2016-04-15 ENCOUNTER — Telehealth: Payer: Self-pay | Admitting: Internal Medicine

## 2016-04-15 LAB — CYTOLOGY - PAP
Diagnosis: NEGATIVE
HPV: NOT DETECTED
Wet Prep (BD Affirm): POSITIVE — AB

## 2016-04-15 MED ORDER — METRONIDAZOLE 500 MG PO TABS: 500.0000 mg | ORAL_TABLET | Freq: Two times a day (BID) | ORAL | 0 refills | Status: AC

## 2016-04-15 NOTE — Telephone Encounter (Signed)
Patient has BV. I have tried to call her to inform her of this. There has been no answer. I have sent metronidazole 500mg  BID x 7 days to her pharmacy. I will try to reach her again to inform her of these results and let her know that a prescription is waiting for her at the pharmacy.

## 2016-04-17 NOTE — Progress Notes (Signed)
Internal Medicine Clinic Attending  Case discussed with Dr. Taylor at the time of the visit.  We reviewed the resident's history and exam and pertinent patient test results.  I agree with the assessment, diagnosis, and plan of care documented in the resident's note. 

## 2016-04-30 ENCOUNTER — Telehealth: Payer: Self-pay | Admitting: *Deleted

## 2016-04-30 ENCOUNTER — Telehealth: Payer: Self-pay

## 2016-04-30 NOTE — Telephone Encounter (Signed)
I have tried to call her multiple times. I will try again tomorrow evening. What is the correct number? Perhaps what is listed in the chart is not correct?

## 2016-04-30 NOTE — Telephone Encounter (Signed)
Call from pt - requesting test results;statest he doctor never called her back. Explain positive for BV, which she said she saw on My Chart. And rx for Flagyl 500 mg was sent to [facility] pharmacy - take 2 tab BID x 7 days. Pt has questions about whether to continue using IUD- states she has been problems w/this device. What are her other choices due hx HTN? Told her I will send message to her and have him to call her back.

## 2016-04-30 NOTE — Telephone Encounter (Signed)
[facility].

## 2016-05-01 NOTE — Telephone Encounter (Signed)
[phone] is the correct; I called pt, stated to call anytime.

## 2016-05-02 NOTE — Telephone Encounter (Signed)
Flagyl rx called to [facility] pharmacy - left message.

## 2016-06-03 ENCOUNTER — Telehealth: Payer: Self-pay | Admitting: Internal Medicine

## 2016-06-03 NOTE — Telephone Encounter (Signed)
CALLED PATIENT, NO ANSWER NO VOICEMAIL, IT IS TIME TO RENEW GCCN CARD

## 2016-06-18 ENCOUNTER — Telehealth: Payer: Self-pay | Admitting: Internal Medicine

## 2016-06-18 NOTE — Telephone Encounter (Signed)
CONFIRMED APT 06/20/16 °

## 2016-06-20 ENCOUNTER — Ambulatory Visit: Payer: Medicaid Other

## 2016-07-08 ENCOUNTER — Encounter: Payer: Self-pay | Admitting: *Deleted

## 2016-07-16 ENCOUNTER — Ambulatory Visit (INDEPENDENT_AMBULATORY_CARE_PROVIDER_SITE_OTHER): Payer: Self-pay | Admitting: *Deleted

## 2016-07-16 DIAGNOSIS — Z111 Encounter for screening for respiratory tuberculosis: Secondary | ICD-10-CM

## 2016-07-16 DIAGNOSIS — Z0000 Encounter for general adult medical examination without abnormal findings: Secondary | ICD-10-CM

## 2016-07-18 ENCOUNTER — Ambulatory Visit (INDEPENDENT_AMBULATORY_CARE_PROVIDER_SITE_OTHER): Payer: Self-pay | Admitting: Internal Medicine

## 2016-07-18 ENCOUNTER — Ambulatory Visit (HOSPITAL_COMMUNITY)
Admission: RE | Admit: 2016-07-18 | Discharge: 2016-07-18 | Disposition: A | Discharge status: Home / Self Care | Payer: Medicaid Other | Source: Ambulatory Visit | Attending: Family Medicine | Admitting: Family Medicine

## 2016-07-18 VITALS — BP 120/76 | HR 93 | Temp 98.2°F | Ht 59.5 in | Wt 120.0 lb

## 2016-07-18 DIAGNOSIS — R9431 Abnormal electrocardiogram [ECG] [EKG]: Secondary | ICD-10-CM

## 2016-07-18 DIAGNOSIS — Z111 Encounter for screening for respiratory tuberculosis: Secondary | ICD-10-CM | POA: Insufficient documentation

## 2016-07-18 DIAGNOSIS — I456 Pre-excitation syndrome: Secondary | ICD-10-CM

## 2016-07-18 LAB — TB SKIN TEST
Induration: 0 mm
TB Skin Test: NEGATIVE

## 2016-07-18 NOTE — Patient Instructions (Signed)
Thank you for visiting clinic today. Your EKG remains the same, if she ever developed racing of your heart, missed beat, chest pain or shortness of breath. Please take immediate medical attention. Please follow-up with us in 6 month if you do not experience any symptoms.

## 2016-07-18 NOTE — Assessment & Plan Note (Signed)
-  Repeat EKG remained the same with PR interval of 112.  Patient is completely asymptomatic at this time.  She was advised to seek medical attention if she developed palpitations and chest pain or shortness of breath, at that time she will need a referral to an electrophysiologist.

## 2016-07-18 NOTE — Assessment & Plan Note (Signed)
She was given PPD on Tuesday, she was due for her read today.  She had less than 1 cm area of bruising with no induration or erythema.  It was a negative test.

## 2016-07-18 NOTE — Progress Notes (Signed)
   CC: For follow-up of her abnormal EKG.  HPI:  Ms.Rhonda Fisher is a 36 y.o. with no significant past medical history came to the clinic for follow-up of her previous abnormal EKG which shows short PR interval with normal QRS. Patient is completely asymptomatic, denies any chest pain, palpitations, missed beat, exertional dyspnea or dizziness.  She has no other complaints at this time. She was recently treated for vaginal candidiasis had a [facility] with Diflucan. She denies any vaginal discharge currently.   She was also here for TB testing read. It was placed on Tuesday. She is having approximately less than 1 cm of bruising, no induration or erythema. It is a negative test.  Past Medical History:  Diagnosis Date  . Abnormal Pap smear    colpo wnl  . Hypertension    currently no meds  . Hypertension     Review of Systems: As per HPI.  Physical Exam:  Vitals:   07/18/16 1451  BP: 120/76  Pulse: 93  Temp: 98.2 F (36.8 C)  TempSrc: Oral  SpO2: 100%  Weight: 120 lb (54.4 kg)  Height: 4' 11.5" (1.511 m)    General: Vital signs reviewed.  Patient is well-developed and well-nourished, in no acute distress and cooperative with exam.  Cardiovascular: RRR, S1 normal, S2 normal, no murmurs, gallops, or rubs. Pulmonary/Chest: Clear to auscultation bilaterally, no wheezes, rales, or rhonchi. Abdominal: Soft, non-tender, non-distended, BS +, no masses, organomegaly, or guarding present.  Extremities: No lower extremity edema bilaterally,  pulses symmetric and intact bilaterally. No cyanosis or clubbing.  Skin: Warm, dry and intact. Less than 1 cm of bruising with no induration or erythema around PPD on right forearm. Psychiatric: Normal mood and affect. speech and behavior is normal. Cognition and memory are normal.  Assessment & Plan:   See Encounters Tab for problem based charting.  Patient discussed with Dr. Oswaldo DoneVincent.

## 2016-07-19 NOTE — Progress Notes (Signed)
Internal Medicine Clinic Attending  Case discussed with Dr. Nelson ChimesAmin at the time of the visit.  We reviewed the resident's history and exam and pertinent patient test results.  I agree with the assessment, diagnosis, and plan of care documented in the resident's note.  I reviewed the ECGs with Dr. Nelson ChimesAmin. She does have a shortened PR interval, but no delta wave, the QRS looks normal, oddly there are Q waves in the inferior leads. Does not look like WPW. 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 However with no history of palpitations, syncope, or documented tachycardias, I think we can monitor this condition for now and defer EP referral.

## 2016-09-19 ENCOUNTER — Encounter (HOSPITAL_COMMUNITY): Payer: Self-pay | Admitting: Emergency Medicine

## 2016-09-19 ENCOUNTER — Emergency Department (HOSPITAL_COMMUNITY)
Admission: EM | Admit: 2016-09-19 | Discharge: 2016-09-19 | Disposition: A | Discharge status: Home / Self Care | Payer: Self-pay | Attending: Emergency Medicine | Admitting: Emergency Medicine

## 2016-09-19 ENCOUNTER — Emergency Department (HOSPITAL_COMMUNITY): Payer: Self-pay

## 2016-09-19 DIAGNOSIS — Z87891 Personal history of nicotine dependence: Secondary | ICD-10-CM | POA: Insufficient documentation

## 2016-09-19 DIAGNOSIS — G5762 Lesion of plantar nerve, left lower limb: Secondary | ICD-10-CM | POA: Insufficient documentation

## 2016-09-19 DIAGNOSIS — Z79899 Other long term (current) drug therapy: Secondary | ICD-10-CM | POA: Insufficient documentation

## 2016-09-19 DIAGNOSIS — M7742 Metatarsalgia, left foot: Secondary | ICD-10-CM

## 2016-09-19 DIAGNOSIS — M79672 Pain in left foot: Secondary | ICD-10-CM

## 2016-09-19 DIAGNOSIS — I10 Essential (primary) hypertension: Secondary | ICD-10-CM | POA: Insufficient documentation

## 2016-09-19 MED ORDER — MELOXICAM 15 MG PO TABS: 15.0000 mg | ORAL_TABLET | Freq: Every day | ORAL | 0 refills | Status: DC

## 2016-09-19 NOTE — ED Triage Notes (Signed)
Pt states "Rhonda Fisher been having trouble with my left foot, I didn't injury it at all, but it had swelling and tenderness for a few weeks. Pt states she takes the depo shot July 13th. Pt states she felt a cramp in the back of her left leg a couple nights ago. Painful ambulation in left foot."

## 2016-09-19 NOTE — ED Provider Notes (Signed)
[facility]-EMERGENCY DEPT Provider Note   CSN: [id number] Arrival date & time: 09/19/16  1194     History   Chief Complaint Chief Complaint  Patient presents with  . Foot Pain  . Leg Pain    HPI Rhonda Fisher Rhonda Fisher is a 36 y.o. female with a PMHx of Lown Ganong Levine Syndrome and HTN, who presents to the ED with complaints of left foot pain 3 weeks with no preceding injury. Patient states that she works in [employer] and is on her feet all day, as well as being on her feet at home because she has 2 children to take care of. About 3 weeks ago she noticed she was having some pain on the dorsal aspect of her L foot along the first through third metatarsals. She describes the pain as 8/10 intermittent throbbing nonradiating left foot pain in those areas, worse with moving her toes, standing on her feet, or putting her shoes on, improved with rest, and with no other treatments tried prior to arrival. She states initially she thought it was swollen in that area however that has resolved. She previously wore heels in the past however she has not worn any heels since she dislocated her knee last year. She also mentions that she had some cramping in her calf muscles last month however she states this happens often and self resolves with repositioning. She denies any estrogen use, recent travel/surgery/immobilization, or personal/family history of DVT/PE. She also denies any bruising, erythema, or warmth to the foot. She also denies any fevers, chills, CP, SOB, abd pain, N/V/D/C, hematuria, dysuria, numbness, tingling, focal weakness, or any other complaints at this time.    The history is provided by the patient and medical records. No language interpreter was used.  Foot Pain  This is a new problem. The current episode started more than 1 week ago. The problem occurs daily. The problem has not changed since onset.Pertinent negatives include no chest pain, no abdominal pain and no shortness of breath. The  symptoms are aggravated by walking (weight bearing, putting shoes on, movement of foot). The symptoms are relieved by rest. She has tried rest for the symptoms. The treatment provided moderate relief.  Leg Pain   Pertinent negatives include no numbness.    Past Medical History:  Diagnosis Date  . Abnormal Pap smear    colpo wnl  . Hypertension    currently no meds  . Hypertension     Patient Active Problem List   Diagnosis Date Noted  . 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 Visit for TB skin test 07/18/2016  . Abnormal EKG 04/11/2016  . Dermatitis 04/11/2016  . Healthcare maintenance 03/03/2015  . Forehead laceration 03/03/2015    Past Surgical History:  Procedure Laterality Date  . COLPOSCOPY    . HERNIA REPAIR      OB History    Gravida Para Term Preterm AB Living   2 2 2  0 0 2   SAB TAB Ectopic Multiple Live Births   0 0 0 0 2       Home Medications    Prior to Admission medications   Medication Sig Start Date End Date Taking? Authorizing Provider  acetaminophen-codeine (TYLENOL #3) 300-30 MG tablet Take 1 tablet by mouth every 4 (four) hours as needed for moderate pain.    [provider]  benzonatate (TESSALON) 100 MG capsule Take 1 capsule (100 mg total) by mouth every 8 (eight) hours. 02/07/16   Derwood Kaplan, MD  docusate sodium (COLACE) 100 MG capsule Take 100 mg by mouth daily.    [provider]  ibuprofen (ADVIL,MOTRIN) 800 MG tablet Take 1 tablet (800 mg total) by mouth 3 (three) times daily. 02/24/15   Earley Favor, NP  Multiple Vitamin (MULTIVITAMIN WITH MINERALS) TABS tablet Take 1 tablet by mouth daily.    [provider]    Family History Family History  Problem Relation Age of Onset  . Hypertension Mother   . Diabetes Mother   . Hypertension Father   . Diabetes Father   . Anesthesia problems Neg Hx     Social History Social History  Substance Use Topics  . Smoking status: Former Smoker    Packs/day: 0.25     Types: Cigarettes    Quit date: 06/11/2011  . Smokeless tobacco: Not on file  . Alcohol use No     Comment: ocassional     Allergies   Patient has no known allergies.   Review of Systems Review of Systems  Constitutional: Negative for chills and fever.  Respiratory: Negative for shortness of breath.   Cardiovascular: Negative for chest pain.  Gastrointestinal: Negative for abdominal pain, constipation, diarrhea, nausea and vomiting.  Genitourinary: Negative for dysuria and hematuria.  Musculoskeletal: Positive for arthralgias. Negative for joint swelling and myalgias.  Skin: Negative for color change.  Allergic/Immunologic: Negative for immunocompromised state.  Neurological: Negative for weakness and numbness.  Psychiatric/Behavioral: Negative for confusion.   All other systems reviewed and are negative for acute change except as noted in the HPI.    Physical Exam Updated Vital Signs BP 132/90 (BP Location: Left Arm)   Pulse 85   Temp 98.5 F (36.9 C) (Oral)   Resp 20   Ht 4' 11.5" (1.511 m)   Wt 54.4 kg (120 lb)   LMP 08/24/2016   SpO2 100%   BMI 23.83 kg/m   Physical Exam  Constitutional: She is oriented to person, place, and time. Vital signs are normal. She appears well-developed and well-nourished.  Non-toxic appearance. No distress.  Afebrile, nontoxic, NAD  HENT:  Head: Normocephalic and atraumatic.  Mouth/Throat: Mucous membranes are normal.  Eyes: Conjunctivae and EOM are normal. Right eye exhibits no discharge. Left eye exhibits no discharge.  Neck: Normal range of motion. Neck supple.  Cardiovascular: Normal rate and intact distal pulses.   Pulmonary/Chest: Effort normal. No respiratory distress.  Abdominal: Normal appearance. She exhibits no distension.  Musculoskeletal: Normal range of motion.       Left foot: There is tenderness and bony tenderness. There is normal range of motion, no swelling, normal capillary refill, no crepitus, no deformity  and no laceration.       Feet:  L foot with moderate TTP in the space between the 1st and 2nd metatarsals, particularly on the dorsal aspect, with a small nodule palpable in this area and palpation of this nodule reproduces pain; no swelling or overlying skin changes, no crepitus or deformity, no erythema or warmth, no bruising, wiggles all toes without difficulty, no tenderness to remainder of foot or ankle/calf, Strength and sensation grossly intact, distal pulses intact, compartments soft.  No pedal edema, neg homan's bilaterally   Neurological: She is alert and oriented to person, place, and time. She has normal strength. No sensory deficit.  Skin: Skin is warm, dry and intact. No rash noted.  Psychiatric: She has a normal mood and affect. Her behavior is normal.  Nursing note and vitals reviewed.    ED Treatments /  Results  Labs (all labs ordered are listed, but only abnormal results are displayed) Labs Reviewed - No data to display  EKG  EKG Interpretation None       Radiology Dg Foot Complete Left  Result Date: 09/19/2016 CLINICAL DATA:  LEFT foot pain for 3 weeks.  No known injury. EXAM: LEFT FOOT - COMPLETE 3+ VIEW COMPARISON:  None. FINDINGS: There is no evidence of fracture or dislocation. There is no evidence of arthropathy or other focal bone abnormality. Pes planus. Soft tissues are unremarkable. IMPRESSION: Pes planus.  No fracture or dislocation. Electronically Signed   By: Elsie Stain M.D.   On: 09/19/2016 11:44    Procedures Procedures (including critical care time)  Medications Ordered in ED Medications - No data to display   Initial Impression / Assessment and Plan / ED Course  I have reviewed the triage vital signs and the nursing notes.  Pertinent labs & imaging results that were available during my care of the patient were reviewed by me and considered in my medical decision making (see chart for details).     36 y.o. female here with L foot pain  x3 wks, no injury or trauma but works on her feet all day and also has kids at home that she cares for, so she's on her feet constantly. On exam, moderate TTP to intermetatarsal area between 1st and 2nd metatarsals, small nodule palpable in this space, no swelling/erythema/warmth, NVI with soft compartments. No calf swelling or tenderness. Doubt DVT. Likely morton's neuroma vs occult stress fx vs metatarsalgia. Will get xray to ensure no occult fx. Pt declines wanting pain meds. Will reassess after xray  12:55 PM Xray neg aside from pes planus being noted. Symptoms likely morton's neuroma vs metatarsalgia. Discussed wide toed shoes, foot pad cushion inserts, and will rx mobic and refer to podiatry for ongoing management. Discussed warm soaks and other OTC remedies for symptomatic relief. I explained the diagnosis and have given explicit precautions to return to the ER including for any other new or worsening symptoms. The patient understands and accepts the medical plan as it's been dictated and I have answered their questions. Discharge instructions concerning home care and prescriptions have been given. The patient is STABLE and is discharged to home in good condition.    Final Clinical Impressions(s) / ED Diagnoses   Final diagnoses:  Foot pain, left  Metatarsalgia of left foot  Morton's metatarsalgia, left    New Prescriptions New Prescriptions   MELOXICAM (MOBIC) 15 MG TABLET    Take 1 tablet (15 mg total) by mouth daily. TAKE WITH [address] [place], [place] 09/19/16 1256    Benjiman Core, MD 09/19/16 [phone]

## 2016-09-19 NOTE — Discharge Instructions (Signed)
Your symptoms could be due to something called morton's neuroma/metatarsalgia. Make sure your shoes have plenty of room in the toe area, avoid squeezing your toes together in shoes that are too tight. Place cushioning inserts into your shoes to help with symptoms. Use ice or heat to the area, using ice/heat pack for no more than 20 minutes every hour.  Use mobic daily as directed, don't use additional NSAIDs like ibuprofen/aleve/etc while taking mobic. Use additional tylenol as needed for additional pain relief.  Call podiatry follow up today or tomorrow to schedule followup appointment for recheck of ongoing foot  pain in 1-2 weeks for ongoing management of your pain. Return to the ER for changes or worsening symptoms.

## 2016-12-01 ENCOUNTER — Encounter (HOSPITAL_COMMUNITY): Payer: Self-pay | Admitting: *Deleted

## 2016-12-01 ENCOUNTER — Ambulatory Visit (HOSPITAL_COMMUNITY)
Admission: EM | Admit: 2016-12-01 | Discharge: 2016-12-01 | Disposition: A | Discharge status: Home / Self Care | Payer: Self-pay | Attending: Internal Medicine | Admitting: Internal Medicine

## 2016-12-01 ENCOUNTER — Ambulatory Visit (INDEPENDENT_AMBULATORY_CARE_PROVIDER_SITE_OTHER): Payer: Self-pay

## 2016-12-01 DIAGNOSIS — B9789 Other viral agents as the cause of diseases classified elsewhere: Secondary | ICD-10-CM

## 2016-12-01 DIAGNOSIS — R05 Cough: Secondary | ICD-10-CM

## 2016-12-01 DIAGNOSIS — J069 Acute upper respiratory infection, unspecified: Secondary | ICD-10-CM

## 2016-12-01 DIAGNOSIS — R0602 Shortness of breath: Secondary | ICD-10-CM

## 2016-12-01 LAB — GLUCOSE, CAPILLARY
Glucose-Capillary: 52 mg/dL — ABNORMAL LOW (ref 65–99)
Glucose-Capillary: 95 mg/dL (ref 65–99)

## 2016-12-01 NOTE — ED Provider Notes (Signed)
[facility]    CSN: [id number] Arrival date & time: 12/01/16  1201     History   Chief Complaint Chief Complaint  Patient presents with  . Shortness of Breath    HPI Rhonda Fisher is a 36 y.o. female.   36 year-old female, presenting today complaining of mild cough, nasal congestion and shortness of breath. She states that her symptoms have been ongoing for the past week. States that when she takes a deep breath in, she feels as though she needs to exhale more quickly than usual - this is what she describes as her shortness of breath. States that she has had similar symptoms in the past, last year. Had a normal CXR done at that time and her symptoms improved on their own over the next few days  She denies headache, neck pain or stiffness, fever, chills, chest pain, abdominal pain, nausea or vomiting   The history is provided by the patient.  Shortness of Breath  Severity:  Mild Onset quality:  Gradual Duration:  1 week Timing:  Intermittent Progression:  Unchanged Chronicity:  Recurrent Context: URI   Context: not activity, not animal exposure, not emotional upset, not fumes, not known allergens, not occupational exposure, not pollens and not smoke exposure   Relieved by:  Nothing Worsened by:  Deep breathing Ineffective treatments:  None tried Associated symptoms: cough   Associated symptoms: no abdominal pain, no chest pain, no claudication, no diaphoresis, no ear pain, no fever, no headaches, no hemoptysis, no neck pain, no PND, no rash, no sore throat, no sputum production, no syncope, no swollen glands, no vomiting and no wheezing   Risk factors: no recent alcohol use, no family hx of DVT, no hx of cancer, no hx of PE/DVT, no obesity and no oral contraceptive use     Past Medical History:  Diagnosis Date  . Abnormal Pap smear    colpo wnl  . Hypertension    currently no meds  . Hypertension     Patient Active Problem List   Diagnosis Date Noted  .  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 Visit for TB skin test 07/18/2016  . Abnormal EKG 04/11/2016  . Dermatitis 04/11/2016  . Healthcare maintenance 03/03/2015  . Forehead laceration 03/03/2015    Past Surgical History:  Procedure Laterality Date  . COLPOSCOPY    . HERNIA REPAIR      OB History    Gravida Para Term Preterm AB Living   2 2 2  0 0 2   SAB TAB Ectopic Multiple Live Births   0 0 0 0 2       Home Medications    Prior to Admission medications   Medication Sig Start Date End Date Taking? Authorizing Provider  MedroxyPROGESTERone Acetate (DEPO-PROVERA IM) Inject into the muscle.   Yes [provider]  acetaminophen-codeine (TYLENOL #3) 300-30 MG tablet Take 1 tablet by mouth every 4 (four) hours as needed for moderate pain.    [provider]  benzonatate (TESSALON) 100 MG capsule Take 1 capsule (100 mg total) by mouth every 8 (eight) hours. 02/07/16   Derwood Kaplan, MD  bifidobacterium infantis (ALIGN) capsule Take 1 capsule by mouth daily.    [provider]  docusate sodium (COLACE) 100 MG capsule Take 100 mg by mouth daily.    [provider]  ibuprofen (ADVIL,MOTRIN) 800 MG tablet Take 1 tablet (800 mg total) by mouth 3 (three) times daily. 02/24/15   Earley Favor,  NP  meloxicam (MOBIC) 15 MG tablet Take 1 tablet (15 mg total) by mouth daily. TAKE WITH MEALS 09/19/16   Street, [place], [place]  Multiple Vitamin (MULTIVITAMIN WITH MINERALS) TABS tablet Take 1 tablet by mouth at bedtime.     [provider]  naproxen sodium (ANAPROX) 220 MG tablet Take 220 mg by mouth daily as needed (forpain).    [provider]  Probiotic Product (ALIGN PO) Take 2-3 capsules by mouth at bedtime as needed (for yeast).    [provider]    Family History Family History  Problem Relation Age of Onset  . Hypertension Mother   . Diabetes Mother   . Hypertension Father   . Diabetes Father   . Anesthesia problems Neg  Hx     Social History Social History   Tobacco Use  . Smoking status: Former Smoker    Packs/day: 0.25    Types: Cigarettes    Last attempt to quit: 06/11/2011    Years since quitting: 5.4  . Smokeless tobacco: Never Used  Substance Use Topics  . Alcohol use: Yes    Comment: occasionally  . Drug use: No     Allergies   Patient has no known allergies.   Review of Systems Review of Systems  Constitutional: Negative for chills, diaphoresis and fever.  HENT: Positive for congestion. Negative for ear pain and sore throat.   Eyes: Negative for pain and visual disturbance.  Respiratory: Positive for cough and shortness of breath. Negative for hemoptysis, sputum production and wheezing.   Cardiovascular: Negative for chest pain, palpitations, claudication, syncope and PND.  Gastrointestinal: Negative for abdominal pain and vomiting.  Genitourinary: Negative for dysuria and hematuria.  Musculoskeletal: Negative for arthralgias, back pain and neck pain.  Skin: Negative for color change and rash.  Neurological: Negative for seizures, syncope and headaches.  All other systems reviewed and are negative.    Physical Exam Triage Vital Signs ED Triage Vitals [12/01/16 1240]  Enc Vitals Group     BP (!) 163/92     Pulse Rate 69     Resp 16     Temp 98.1 F (36.7 C)     Temp Source Oral     SpO2 100 %     Weight      Height      Head Circumference      Peak Flow      Pain Score      Pain Loc      Pain Edu?      Excl. in GC?    No data found.  Updated Vital Signs BP (!) 163/92 Comment: Hx HTN with meds in 20s; was discontinued by provider  Pulse 69   Temp 98.1 F (36.7 C) (Oral)   Resp 16   SpO2 100%   Visual Acuity Right Eye Distance:   Left Eye Distance:   Bilateral Distance:    Right Eye Near:   Left Eye Near:    Bilateral Near:     Physical Exam  Constitutional: She appears well-developed and well-nourished. No distress.  HENT:  Head: Normocephalic  and atraumatic.  Eyes: Conjunctivae are normal.  Neck: Neck supple.  Cardiovascular: Normal rate and regular rhythm.  No murmur heard. Pulmonary/Chest: Effort normal and breath sounds normal. No respiratory distress. She has no decreased breath sounds. She has no wheezes. She has no rhonchi. She has no rales.  Abdominal: Soft. There is no tenderness.  Musculoskeletal: She exhibits no edema.  Neurological:  She is alert.  Skin: Skin is warm and dry.  Psychiatric: She has a normal mood and affect.  Nursing note and vitals reviewed.    UC Treatments / Results  Labs (all labs ordered are listed, but only abnormal results are displayed) Labs Reviewed  GLUCOSE, CAPILLARY - Abnormal; Notable for the following components:      Result Value   Glucose-Capillary 52 (*)    All other components within normal limits  GLUCOSE, CAPILLARY    EKG  EKG Interpretation None       Radiology Dg Chest 2 View  Result Date: 12/01/2016 CLINICAL DATA:  Shortness of breath. Rhinorrhea and cough for the last week. EXAM: CHEST  2 VIEW COMPARISON:  Two-view chest x-ray 02/24/2016 FINDINGS: The heart size and mediastinal contours are within normal limits. Both lungs are clear. The visualized skeletal structures are unremarkable. IMPRESSION: Negative two view chest x-ray Electronically Signed   By: Marin Robertshristopher  Mattern M.D.   On: 12/01/2016 13:56    Procedures Procedures (including critical care time)  Medications Ordered in UC Medications - No data to display   Initial Impression / Assessment and Plan / UC Course  I have reviewed the triage vital signs and the nursing notes.  Pertinent labs & imaging results that were available during my care of the patient were reviewed by me and considered in my medical decision making (see chart for details).     Shortness of breath - describes this as being unable to inhale for long periods of time. Normal CXR. No associated chest pain, tachycardia,  palpitations. PERC negative Will dc home with symptomatic treatment. PCP follow-up as needed Patient complained of feeling shaky on arrival. Blood sugar in the low 50s. Patient ate graham crackers and drank gatorade. Sugar now up to 95. She feels much better. She will keep an eye on her sugar today and follow-up with PCP  Final Clinical Impressions(s) / UC Diagnoses   Final diagnoses:  Viral URI with cough    New Prescriptions This SmartLink is deprecated. Use AVSMEDLIST instead to display the medication list for a patient.   Controlled Substance Prescriptions [place] Controlled Substance Registry consulted? Not Applicable   Alecia LemmingBlue, Maddisen Vought C, [place] 12/01/16 1515

## 2016-12-01 NOTE — ED Notes (Signed)
CBG 52, Olivia B, PA notified, tp given more graham crackers and gatorade to drink.

## 2016-12-01 NOTE — ED Triage Notes (Signed)
C/O intermittent SOB, occasional runny nose and congestion and slight intermittent cough x 1 wk.  Denies fevers.

## 2018-01-05 IMAGING — DX DG FOOT COMPLETE 3+V*L*
3 series · 3 of 3 positions shown · non-contrast
Comparison: None.

CLINICAL DATA: LEFT foot pain for 3 weeks.  No known injury.

EXAM:
LEFT FOOT - COMPLETE 3+ VIEW

[foot ap]
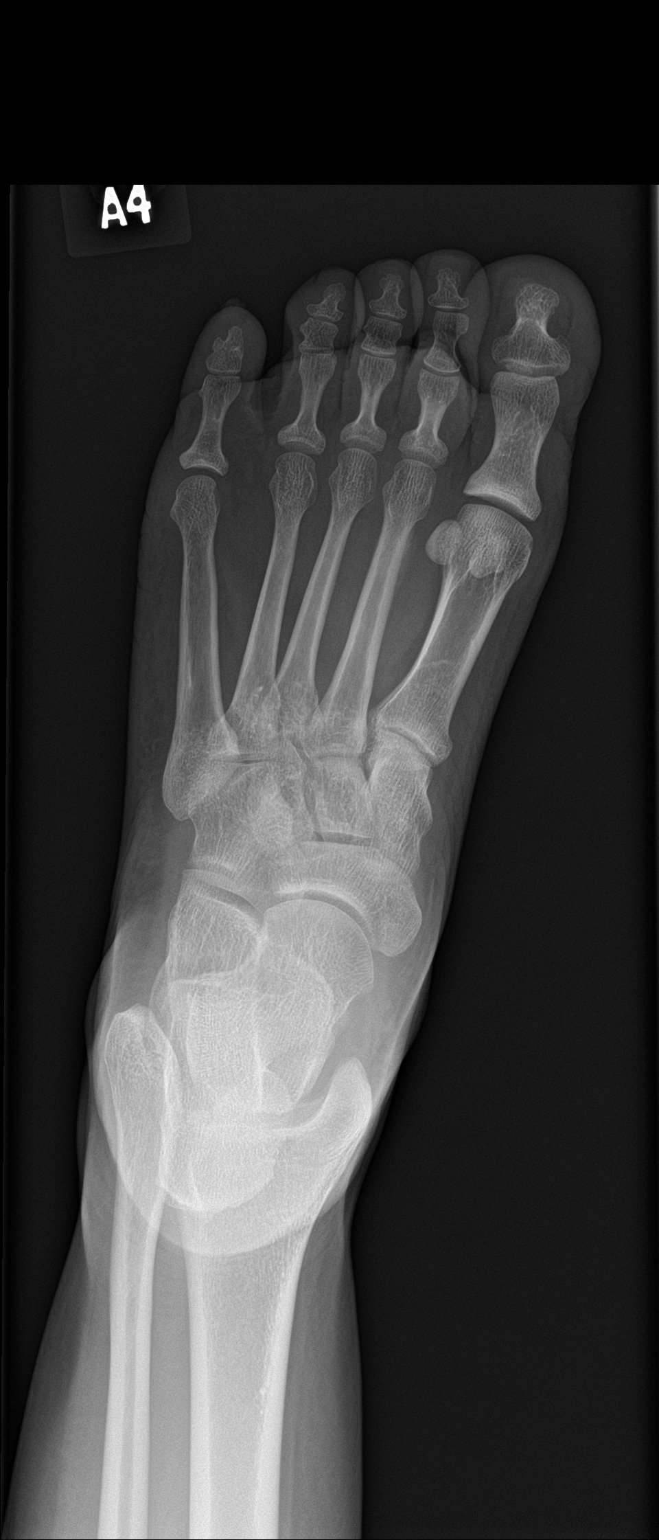

[foot obl]
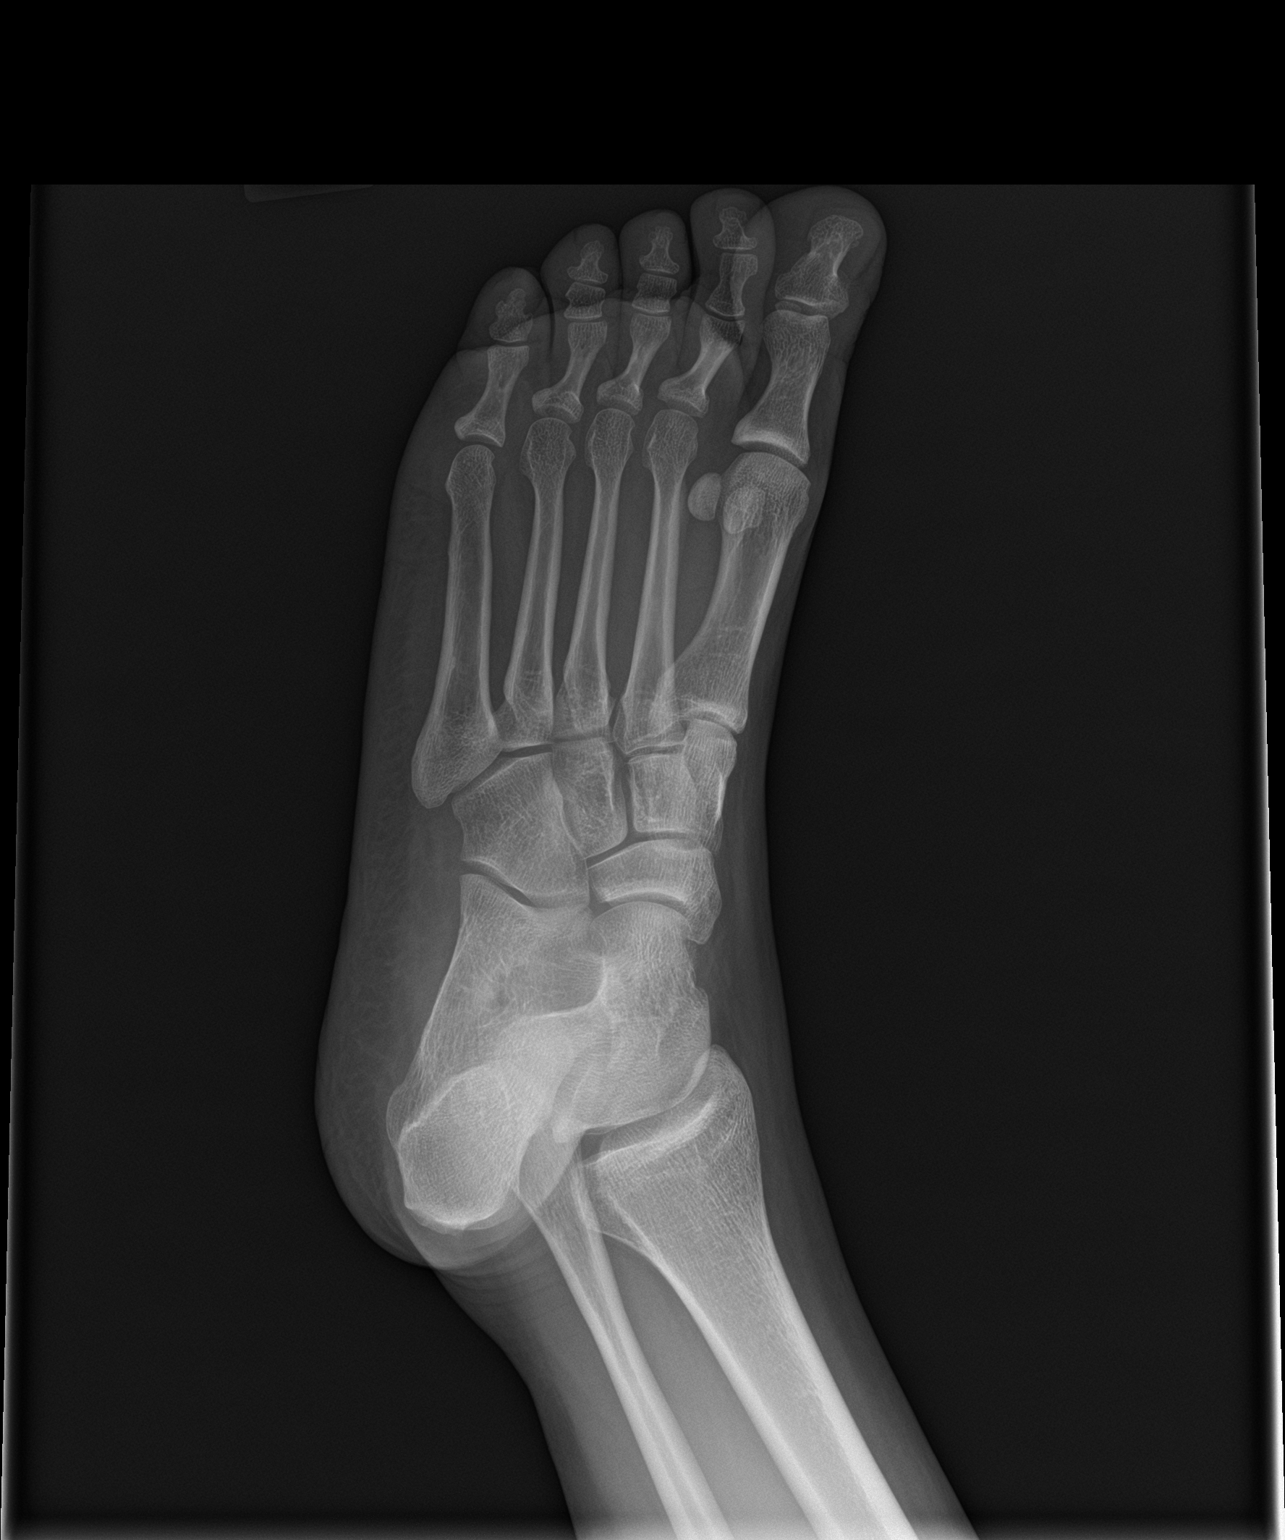

[foot lat]
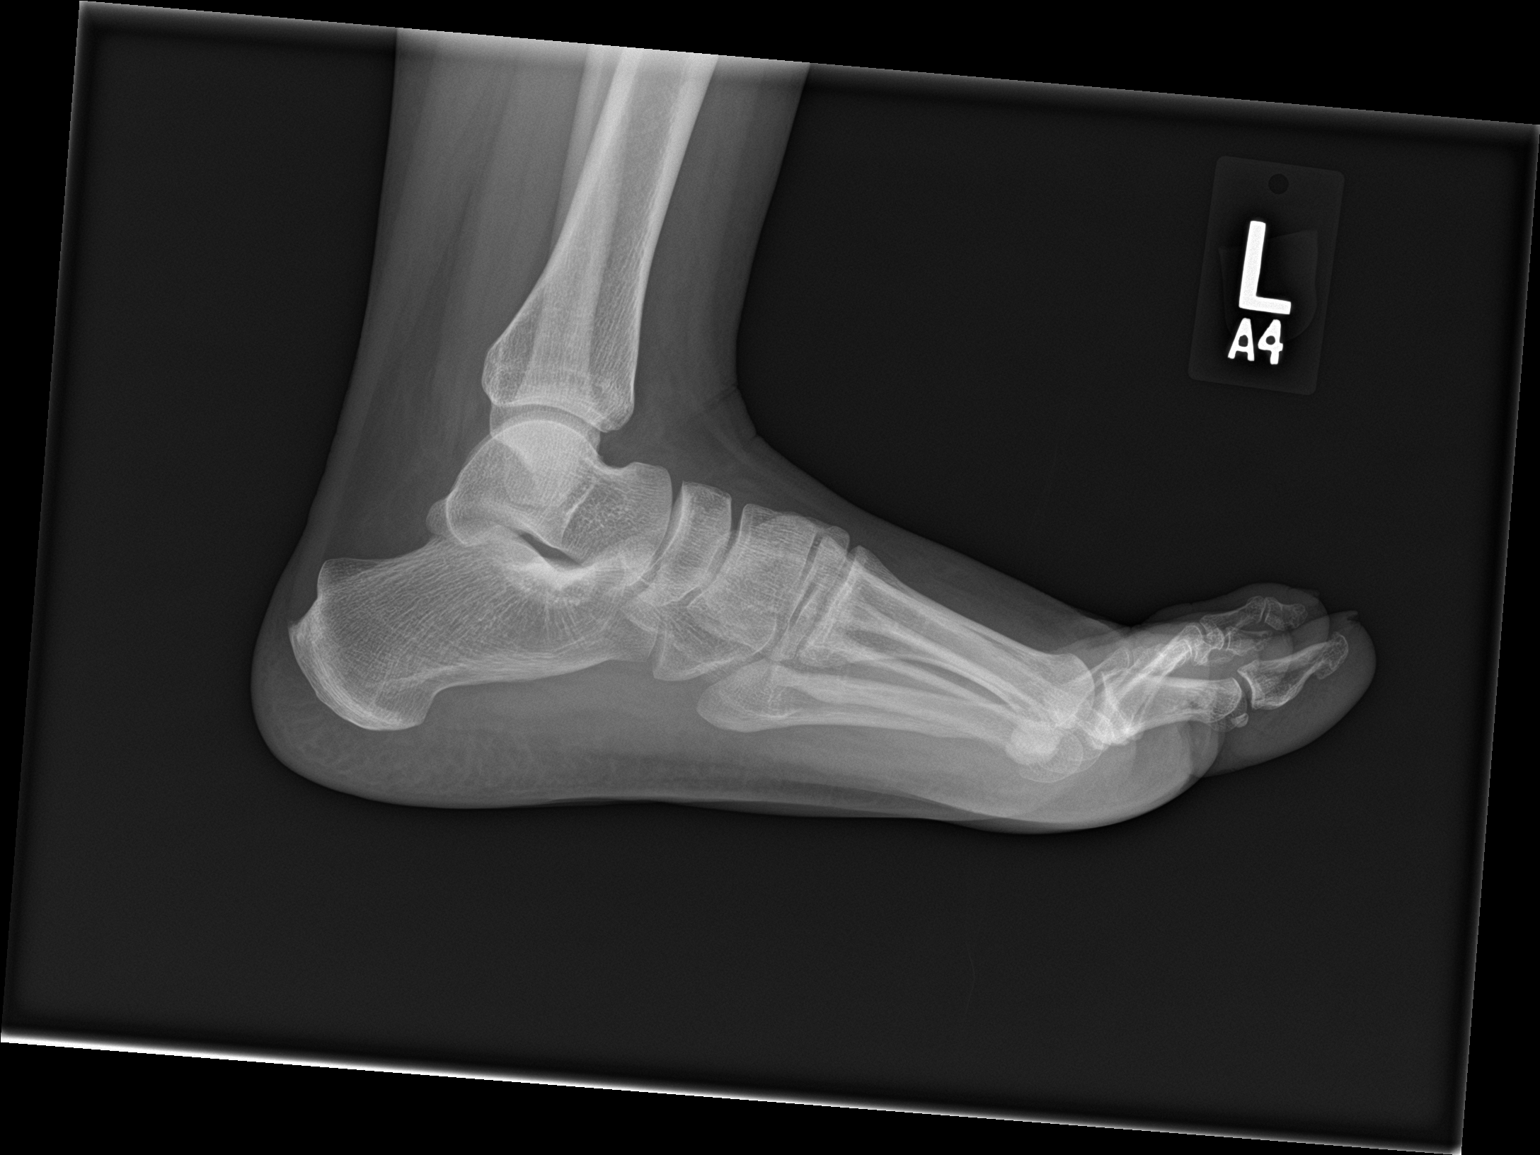

[3 of 3 positions shown; findings below may reference images not displayed]

FINDINGS: There is no evidence of fracture or dislocation. There is no
evidence of arthropathy or other focal bone abnormality. Pes planus.
Soft tissues are unremarkable.
IMPRESSION: Pes planus.  No fracture or dislocation.

## 2018-03-19 IMAGING — DX DG CHEST 2V
2 series · 2 of 2 positions shown · non-contrast
Comparison: Two-view chest x-ray 02/24/2016

CLINICAL DATA: Shortness of breath. Rhinorrhea and cough for the
last week.

EXAM:
CHEST  2 VIEW

[chest pa]
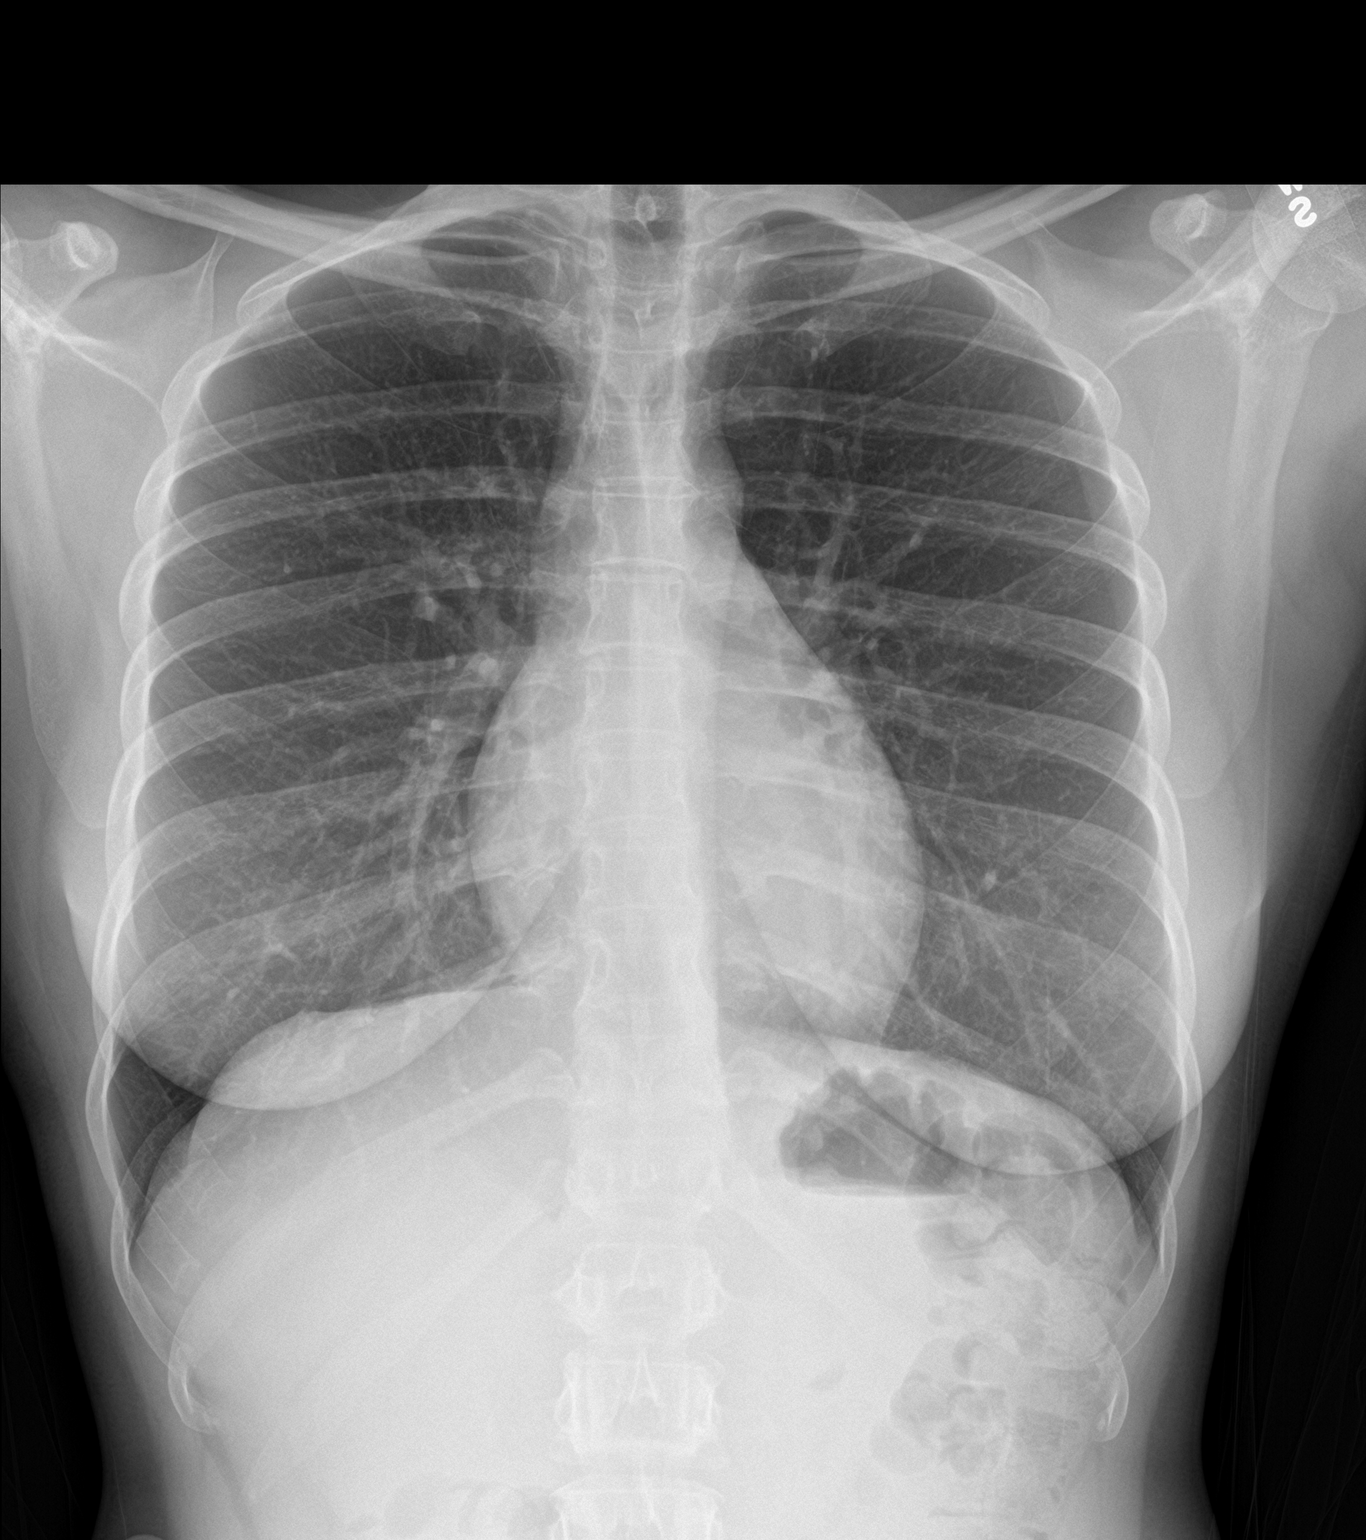

[chest lat]
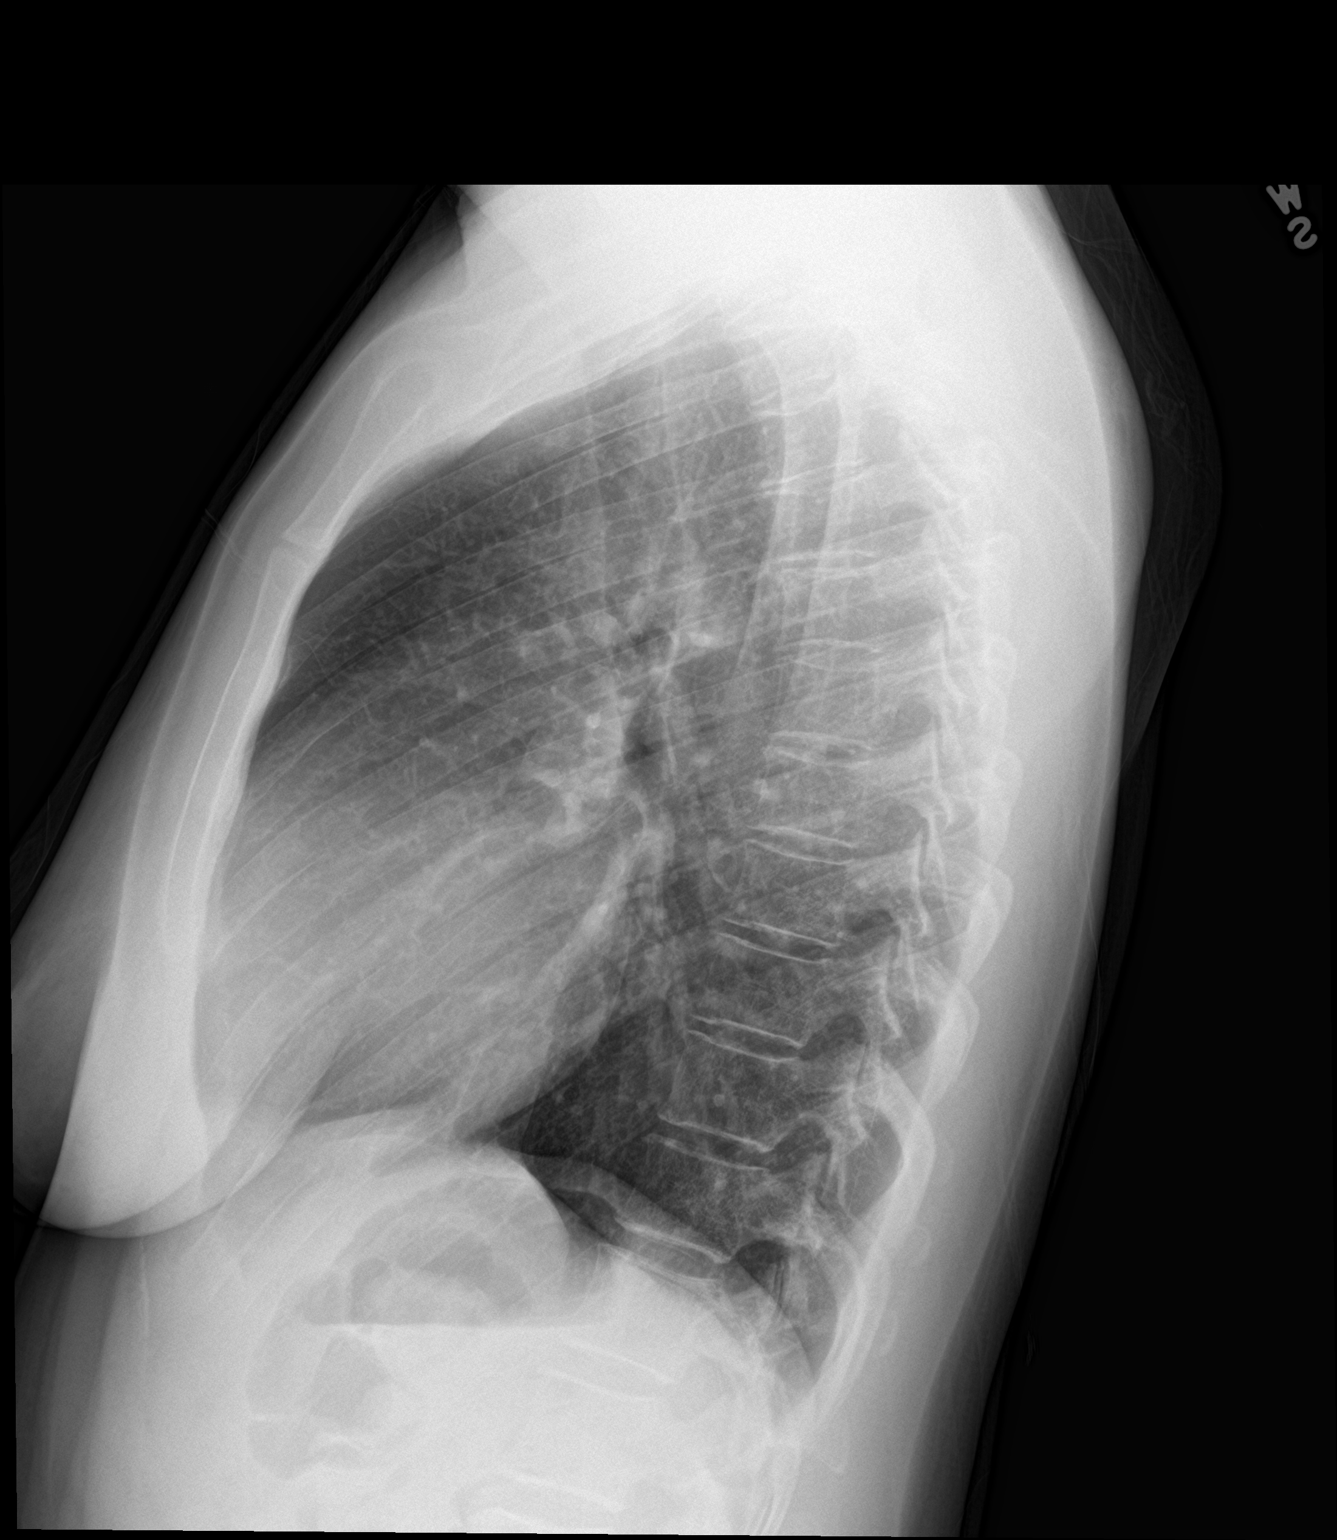

[2 of 2 positions shown; findings below may reference images not displayed]

FINDINGS: The heart size and mediastinal contours are within normal limits.
Both lungs are clear. The visualized skeletal structures are
unremarkable.
IMPRESSION: Negative two view chest x-ray

## 2018-07-07 ENCOUNTER — Ambulatory Visit (INDEPENDENT_AMBULATORY_CARE_PROVIDER_SITE_OTHER): Payer: BC Managed Care – PPO | Admitting: Allergy & Immunology

## 2018-07-07 ENCOUNTER — Other Ambulatory Visit: Payer: Self-pay

## 2018-07-07 ENCOUNTER — Encounter: Payer: Self-pay | Admitting: Allergy & Immunology

## 2018-07-07 VITALS — Temp 98.4°F | Resp 16 | Ht 59.0 in | Wt 147.4 lb

## 2018-07-07 DIAGNOSIS — J3089 Other allergic rhinitis: Secondary | ICD-10-CM | POA: Diagnosis not present

## 2018-07-07 DIAGNOSIS — K219 Gastro-esophageal reflux disease without esophagitis: Secondary | ICD-10-CM

## 2018-07-07 DIAGNOSIS — J302 Other seasonal allergic rhinitis: Secondary | ICD-10-CM

## 2018-07-07 NOTE — Progress Notes (Signed)
NEW PATIENT  Date of Service/Encounter:  07/07/18  Referring provider: Patient, No Pcp Per   Assessment:   Seasonal and perennial allergic rhinitis (grasses, ragweed, weeds, trees, indoor molds, outdoor molds, dust mites and cat)  GERD  Plan/Recommendations:   1.  Perennial and Seasonal Allergic Rhinitis - Testing today showed: grasses, ragweed, weeds, trees, indoor molds, outdoor molds, dust mites and cat - Copy of test results provided.  - Avoidance measures provided. - Start taking: Zyrtec (cetirizine) 10 mg once daily - I am surprised that you are not having more symptoms given everything you were sensitized to. - However, if symptoms get worse over time, we can consider other treatment options.  2. Follow up in six months or earlier if needed. This can be an in-person, a virtual Webex or a telephone follow up visit.   Subjective:   Rhonda Fisher is a 38 y.o. female presenting today for evaluation of  Chief Complaint  Patient presents with  . Allergy Testing    Rhonda Fisher has a history of the following: Patient Active Problem List   Diagnosis Date Noted  . Seasonal and perennial allergic rhinitis 07/07/2018  . 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 Visit for TB skin test 07/18/2016  . Abnormal EKG 04/11/2016  . Dermatitis 04/11/2016  . Healthcare maintenance 03/03/2015  . Forehead laceration 03/03/2015    History obtained from: chart review and patient.  Rhonda Fisher was referred by Patient, No Pcp Per.     Rhonda is a 38 y.o. female presenting for an evaluation of environmental allergies.  She tells me that she was first diagnosed with allergies in her mid 4220s.  Apparently, she was getting sick with what she thought was summer colds.  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  Regardless, her primary care provider at the time diagnosed with allergies and gave her some meds.  She does not remember what  the meds were, as she never took them, at least regularly.  She did not think they were related to allergies.  However, her symptoms always happen in the summer months.  She does report some sneezing and itchy nose during the spring and summer.  She denies any kind of nasal symptoms whatsoever, including congestion, runny nose, or other nasal complaints.  She has no problem at all breathing through her nose.  She has never used a nose spray.  She will use of Benadryl occasionally on a as needed basis.  She has no history of asthma.  She does have a history of GERD, which acutely worsened when she was pregnant never really resolved.  She tries treating this with Tums but has never taken anything more than that.   Otherwise, there is no history of other atopic diseases, including asthma, food allergies, drug allergies, stinging insect allergies, eczema or urticaria. There is no significant infectious history. Vaccinations are up to date.    Past Medical History: Patient Active Problem List   Diagnosis Date Noted  . Seasonal and perennial allergic rhinitis 07/07/2018  . 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 Visit for TB skin test 07/18/2016  . Abnormal EKG 04/11/2016  . Dermatitis 04/11/2016  . Healthcare maintenance 03/03/2015  . Forehead laceration 03/03/2015    Medication List:  Allergies as of 07/07/2018   No Known Allergies     Medication List       Accurate as of July 07, 2018  4:26 PM. If you have any questions, ask your nurse or doctor.        STOP taking these medications   acetaminophen-codeine 300-30 MG tablet Commonly known as:  TYLENOL #3 Stopped by:  Alfonse SpruceJoel Louis Jeannia Tatro, MD   ALIGN PO Stopped by:  Alfonse SpruceJoel Louis Niguel Moure, MD   benzonatate 100 MG capsule Commonly known as:  TESSALON Stopped by:  Alfonse SpruceJoel Louis Demia Viera, MD   bifidobacterium infantis capsule Stopped by:  Alfonse SpruceJoel Louis Lisa Milian, MD   docusate sodium 100 MG capsule Commonly known as:  COLACE Stopped  by:  Alfonse SpruceJoel Louis Orlanda Frankum, MD   ibuprofen 800 MG tablet Commonly known as:  ADVIL Stopped by:  Alfonse SpruceJoel Louis Kairee Kozma, MD   meloxicam 15 MG tablet Commonly known as:  MOBIC Stopped by:  Alfonse SpruceJoel Louis Acelynn Dejonge, MD   naproxen sodium 220 MG tablet Commonly known as:  ALEVE Stopped by:  Alfonse SpruceJoel Louis Khali Albanese, MD     TAKE these medications   CALCIUM 1200 PO Take by mouth.   DEPO-PROVERA IM Inject into the muscle.   multivitamin with minerals Tabs tablet Take 1 tablet by mouth at bedtime.       Birth History: non-contributory  Developmental History: non-contributory  Past Surgical History: Past Surgical History:  Procedure Laterality Date  . COLPOSCOPY    . HERNIA REPAIR       Family History: Family History  Problem Relation Age of Onset  . Hypertension Mother   . Diabetes Mother   . Hypertension Father   . Diabetes Father   . Anesthesia problems Neg Hx      Social History: Rhonda lives at home with her family.  They live in a house with hardwood throughout the home.  Prior to this, they did live in a house that had mold issues.  They have gas heating and central cooling.  There is a dog in the home.  She does have dust mite covers on her bed, but not her pillows.  There is tobacco exposure in the house.  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  Review of Systems  Constitutional: Negative.  Negative for fever, malaise/fatigue and weight loss.  HENT: Negative.  Negative for congestion, ear discharge and ear pain.        Positive for sneezing.  Positive for itchy nose.  Eyes: Negative for pain, discharge and redness.  Respiratory: Negative for cough, sputum production, shortness of breath and wheezing.   Cardiovascular: Negative.  Negative for chest pain and palpitations.  Gastrointestinal: Negative for abdominal pain and heartburn.  Skin: Negative.  Negative for itching and  rash.  Neurological: Negative for dizziness and headaches.  Endo/Heme/Allergies: Positive for environmental allergies. Does not bruise/bleed easily.       Objective:   Temperature 98.4 F (36.9 C), temperature source Temporal, resp. rate 16, height 4\' 11"  (1.499 m), weight 147 lb 6.4 oz (66.9 kg). Body mass index is 29.77 kg/m.   Physical Exam:   Physical Exam  Constitutional: She appears well-developed.  Pleasant female.  Very interactive.  HENT:  Head: Normocephalic and atraumatic.  Right Ear: Tympanic membrane, external ear and ear canal normal. No drainage, swelling or tenderness. Tympanic membrane is not injected, not scarred, not erythematous, not retracted and not bulging.  Left Ear: Tympanic membrane, external ear and ear canal normal. No drainage, swelling or tenderness. Tympanic membrane is not injected, not scarred,  not erythematous, not retracted and not bulging.  Nose: Mucosal edema and rhinorrhea present. No nasal deformity or septal deviation. No epistaxis. Right sinus exhibits no maxillary sinus tenderness and no frontal sinus tenderness. Left sinus exhibits no maxillary sinus tenderness and no frontal sinus tenderness.  Mouth/Throat: Uvula is midline and oropharynx is clear and moist. Mucous membranes are not pale and not dry.  She does have some cobblestoning in the posterior oropharynx.  Eyes: Pupils are equal, round, and reactive to light. Conjunctivae and EOM are normal. Right eye exhibits no chemosis and no discharge. Left eye exhibits no chemosis and no discharge. Right conjunctiva is not injected. Left conjunctiva is not injected.  Cardiovascular: Normal rate, regular rhythm and normal heart sounds.  Respiratory: Effort normal and breath sounds normal. No accessory muscle usage. No tachypnea. No respiratory distress. She has no wheezes. She has no rhonchi. She has no rales. She exhibits no tenderness.  Moving air well in all lung fields.  GI: There is no  abdominal tenderness. There is no rebound and no guarding.  Lymphadenopathy:       Head (right side): No submandibular, no tonsillar and no occipital adenopathy present.       Head (left side): No submandibular, no tonsillar and no occipital adenopathy present.    She has no cervical adenopathy.  Neurological: She is alert.  Skin: No abrasion, no petechiae and no rash noted. Rash is not papular, not vesicular and not urticarial. No erythema. No pallor.  No eczematous or urticarial lesions noted.  Psychiatric: She has a normal mood and affect.     Diagnostic studies:    Allergy Studies:    Airborne Adult Perc - 07/07/18 1432    Time Antigen Placed  1432    Allergen Manufacturer  Lavella Hammock    Location  Back    Number of Test  59    1. Control-Buffer 50% Glycerol  Negative    2. Control-Histamine 1 mg/ml  2+    3. Albumin saline  Negative    4. [place]wn  2+    5. [place]  2+    6. Johnson  2+    7. [place] Blue  3+    8. Meadow Fescue  3+    9. Perennial Rye  3+    10. Sweet Vernal  2+    11. Timothy  2+    12. Cocklebur  2+    13. Burweed Marshelder  Negative    14. Ragweed, short  4+    15. Ragweed, Giant  3+    16. Plantain,  English  2+    17. Lamb's Quarters  2+    18. Sheep Sorrell  2+    19. Rough Pigweed  Negative    20. Marsh Elder, Rough  2+    21. Mugwort, Common  3+    22. Ash mix  Negative    23. Birch mix  Negative    24. Beech American  Negative    25. Box, Elder  Negative    26. Cedar, red  Negative    27. Cottonwood, [place]  Negative    28. Elm mix  Negative    29. Hickory mix  2+    30. Maple mix  2+    31. Oak, [place] mix  2+    32. Pecan Pollen  2+    33. Pine mix  Negative    34. Sycamore Eastern  Negative    35. Diamond Ridge, Black Pollen  Negative    36. Alternaria alternata  Negative    37. Cladosporium Herbarum  2+    38. Aspergillus mix  2+    39. Penicillium mix  2+    40. Bipolaris sorokiniana (Helminthosporium)  Negative    41. Drechslera  spicifera (Curvularia)  2+    42. Mucor plumbeus  2+    43. Fusarium moniliforme  2+    44. Aureobasidium pullulans (pullulara)  2+    45. Rhizopus oryzae  2+    46. Botrytis cinera  2+    47. Epicoccum nigrum  3+    48. Phoma betae  Negative    49. Candida Albicans  Negative    50. Trichophyton mentagrophytes  2+    51. Mite, D Farinae  5,000 AU/ml  Negative   +/-   52. Mite, D Pteronyssinus  5,000 AU/ml  Negative   +/-   53. Cat Hair 10,000 BAU/ml  Negative   +/-   54.  Dog Epithelia  Negative    55. Mixed Feathers  Negative    56. Horse Epithelia  Negative    57. Cockroach, German  Negative    58. Mouse  Negative    59. Tobacco Leaf  Negative       Allergy testing results were read and interpreted by myself, documented by clinical staff.         Malachi BondsJoel Shaira Sova, MD [facility]

## 2018-07-07 NOTE — Patient Instructions (Signed)
1.  Perennial and Seasonal Allergic Rhinitis - Testing today showed: grasses, ragweed, weeds, trees, indoor molds, outdoor molds, dust mites and cat - Copy of test results provided.  - Avoidance measures provided. - Start taking: Zyrtec (cetirizine) 10 mg once daily - I am surprised that you are not having more symptoms given everything you were sensitized to. - However, if symptoms get worse over time, we can consider other treatment options.  2. No follow-ups on file. This can be an in-person, a virtual Webex or a telephone follow up visit.   Please inform us of any Emergency Department visits, hospitalizations, or changes in symptoms. Call us before going to the ED for breathing or allergy symptoms since we might be able to fit you in for a sick visit. Feel free to contact us anytime with any questions, problems, or concerns.  It was a pleasure to see you again today!  Your kids are getting more durable each time I see them!   Websites that have reliable patient information: 1. American Academy of Asthma, Allergy, and Immunology: www.aaaai.org 2. Food Allergy Research and Education (FARE): foodallergy.org 3. Mothers of Asthmatics: http://www.asthmacommunitynetwork.org 4. American College of Allergy, Asthma, and Immunology: www.acaai.org  "Like" us on Facebook and Instagram for our latest updates!      Make sure you are registered to vote! If you have moved or changed any of your contact information, you will need to get this updated before voting!    Voter ID laws are NOT going into effect for the General Election in November 2020! DO NOT let this stop you from exercising your right to vote!   Absentee voting is the SAFEST way to vote during the coronavirus pandemic! Download and print an absentee ballot request form at [email or web address]://[email or web address]  More information on absentee ballots can be found here:  [email or web address].ncvoter.org/absentee-ballots/   Reducing Pollen Exposure  The American Academy of Allergy, Asthma and Immunology suggests the following steps to reduce your exposure to pollen during allergy seasons.    1. Do not hang sheets or clothing out to dry; pollen may collect on these items. 2. Do not mow lawns or spend time around freshly cut grass; mowing stirs up pollen. 3. Keep windows closed at night.  Keep car windows closed while driving. 4. Minimize morning activities outdoors, a time when pollen counts are usually at their highest. 5. Stay indoors as much as possible when pollen counts or humidity is high and on windy days when pollen tends to remain in the air longer. 6. Use air conditioning when possible.  Many air conditioners have filters that trap the pollen spores. 7. Use a HEPA room air filter to remove pollen form the indoor air you breathe. Control of Mold Allergen   Mold and fungi can grow on a variety of surfaces provided certain temperature and moisture conditions exist.  Outdoor molds grow on plants, decaying vegetation and soil.  The major outdoor mold, Alternaria and Cladosporium, are found in very high numbers during hot and dry conditions.  Generally, a late Summer - Fall peak is seen for common outdoor fungal spores.  Rain will temporarily lower outdoor mold spore count, but counts rise rapidly when the rainy period ends.  The most important indoor molds are Aspergillus and Penicillium.  Dark, humid and poorly ventilated basements are ideal sites for mold growth.  The next most common sites of mold growth are the bathroom and the kitchen.  Outdoor (Seasonal) Mold Control  Positive outdoor molds via skin  testing: Cladosporium, Drechslera (Curvalaria), Mucor and Epicoccum  1. Use air conditioning and keep windows closed 2. Avoid exposure to decaying vegetation. 3. Avoid leaf raking. 4. Avoid grain handling. 5. Consider wearing a face mask if working in moldy  areas.  6.   Indoor (Perennial) Mold Control   Positive indoor molds via skin testing: Aspergillus, Penicillium, Fusarium, Aureobasidium (Pullulara), Rhizopus, Botrytis and Trichophyton  1. Maintain humidity below 50%. 2. Clean washable surfaces with 5% bleach solution. 3. Remove sources e.g. contaminated carpets.     Control of House Dust Mite Allergen    House dust mites play a major role in allergic asthma and rhinitis.  They occur in environments with high humidity wherever human skin, the food for dust mites is found. High levels have been detected in dust obtained from mattresses, pillows, carpets, upholstered furniture, bed covers, clothes and soft toys.  The principal allergen of the house dust mite is found in its feces.  A gram of dust may contain 1,000 mites and 250,000 fecal particles.  Mite antigen is easily measured in the air during house cleaning activities.    1. Encase mattresses, including the box spring, and pillow, in an air tight cover.  Seal the zipper end of the encased mattresses with wide adhesive tape. 2. Wash the bedding in water of 130 degrees Farenheit weekly.  Avoid cotton comforters/quilts and flannel bedding: the most ideal bed covering is the dacron comforter. 3. Remove all upholstered furniture from the bedroom. 4. Remove carpets, carpet padding, rugs, and non-washable window drapes from the bedroom.  Wash drapes weekly or use plastic window coverings. 5. Remove all non-washable stuffed toys from the bedroom.  Wash stuffed toys weekly. 6. Have the room cleaned frequently with a vacuum cleaner and a damp dust-mop.  The patient should not be in a room which is being cleaned and should wait 1 hour after cleaning before going into the room. 7. Close and seal all heating outlets in the bedroom.  Otherwise, the room will become filled with dust-laden air.  An electric heater can be used to heat the room. 8. Reduce indoor humidity to less than 50%.  Do not use  a humidifier.  Control of Dog or Cat Allergen  Avoidance is the best way to manage a dog or cat allergy. If you have a dog or cat and are allergic to dog or cats, consider removing the dog or cat from the home. If you have a dog or cat but don't want to find it a new home, or if your family wants a pet even though someone in the household is allergic, here are some strategies that may help keep symptoms at bay:  1. Keep the pet out of your bedroom and restrict it to only a few rooms. Be advised that keeping the dog or cat in only one room will not limit the allergens to that room. 2. Don't pet, hug or kiss the dog or cat; if you do, wash your hands with soap and water. 3. High-efficiency particulate air (HEPA) cleaners run continuously in a bedroom or living room can reduce allergen levels over time. 4. Regular use of a high-efficiency vacuum cleaner or a central vacuum can reduce allergen levels. 5. Giving your dog or cat a bath at least once a week can reduce airborne allergen.

## 2018-07-19 ENCOUNTER — Other Ambulatory Visit: Payer: Self-pay | Admitting: *Deleted

## 2018-07-19 DIAGNOSIS — Z20822 Contact with and (suspected) exposure to covid-19: Secondary | ICD-10-CM

## 2018-07-27 ENCOUNTER — Telehealth: Payer: Self-pay

## 2018-07-27 LAB — NOVEL CORONAVIRUS, NAA: SARS-CoV-2, NAA: NOT DETECTED

## 2018-07-27 NOTE — Telephone Encounter (Signed)
Pt. Checking on COVID 19 results - not available. 

## 2018-10-09 ENCOUNTER — Encounter: Payer: Self-pay | Admitting: Cardiology

## 2018-10-09 ENCOUNTER — Other Ambulatory Visit: Payer: Self-pay

## 2018-10-09 ENCOUNTER — Ambulatory Visit (INDEPENDENT_AMBULATORY_CARE_PROVIDER_SITE_OTHER): Payer: BC Managed Care – PPO | Admitting: Cardiology

## 2018-10-09 VITALS — BP 137/104 | HR 78 | Temp 97.8°F | Ht 59.5 in | Wt 152.0 lb

## 2018-10-09 DIAGNOSIS — I10 Essential (primary) hypertension: Secondary | ICD-10-CM | POA: Diagnosis not present

## 2018-10-09 DIAGNOSIS — R0789 Other chest pain: Secondary | ICD-10-CM | POA: Diagnosis not present

## 2018-10-09 MED ORDER — AMLODIPINE BESYLATE 5 MG PO TABS: 5.0000 mg | ORAL_TABLET | Freq: Every day | ORAL | 2 refills | Status: DC

## 2018-10-09 NOTE — Progress Notes (Signed)
Primary Physician:  Deland Pretty, MD   Patient ID: Rhonda Fisher, female    DOB: 1980-04-08, 38 y.o.   MRN: [id number]  Subjective:    Chief Complaint  Patient presents with  . Chest Pain    ON AND OFF CHEST PAIN  . New Patient (Initial Visit)    HPI: Rhonda Fisher  is a 38 y.o. female  with hypertension referred to [place] STAT basis by Dr. Shelia Media for chest pain.   She has had 2 episodes of chest pain this week, once while laying in bed, and the next episode while sitting in the chair. Both episodes brief. She has previously had sharp chest pain like while taking a shower. Episodes generally 30 sec. No associated shortness of breath or pain radiation. She is having some issues with allergies.   No history of hyperlipidemia or diabetes.   She has not exercised regularly, but has walked 30 minutes over the summer without difficulty. No family history of heart disease.  Past Medical History:  Diagnosis Date  . Abnormal Pap smear    colpo wnl  . Hypertension    currently no meds  . Hypertension   . Seasonal allergies     Past Surgical History:  Procedure Laterality Date  . COLPOSCOPY    . HERNIA REPAIR    . HERNIA REPAIR      Social History   Socioeconomic History  . Marital status: Single    Spouse name: Not on file  . Number of children: 2  . Years of education: Not on file  . Highest education level: Not on file  Occupational History  . Not on file  Social Needs  . Financial resource strain: Not on file  . Food insecurity    Worry: Not on file    Inability: Not on file  . Transportation needs    Medical: Not on file    Non-medical: Not on file  Tobacco Use  . Smoking status: Former Smoker    Packs/day: 0.25    Types: Cigarettes    Quit date: 06/11/2011    Years since quitting: 7.3  . Smokeless tobacco: Never Used  Substance and Sexual Activity  . Alcohol use: Yes    Comment: occasionally  . Drug use: No  . Sexual activity: Not on file  Lifestyle  .  Physical activity    Days per week: Not on file    Minutes per session: Not on file  . Stress: Not on file  Relationships  . Social Herbalist on phone: Not on file    Gets together: Not on file    Attends religious service: Not on file    Active member of club or organization: Not on file    Attends meetings of clubs or organizations: Not on file    Relationship status: Not on file  . Intimate partner violence    Fear of current or ex partner: Not on file    Emotionally abused: Not on file    Physically abused: Not on file    Forced sexual activity: Not on file  Other Topics Concern  . Not on file  Social History Narrative  . Not on file    Review of Systems  Constitution: Negative for decreased appetite, malaise/fatigue, weight gain and weight loss.  Eyes: Negative for visual disturbance.  Cardiovascular: Positive for chest pain. Negative for claudication, dyspnea on exertion, leg swelling, orthopnea, palpitations and syncope.  Respiratory: Negative for  hemoptysis and wheezing.   Endocrine: Negative for cold intolerance and heat intolerance.  Hematologic/Lymphatic: Does not bruise/bleed easily.  Skin: Negative for nail changes.  Musculoskeletal: Negative for muscle weakness and myalgias.  Gastrointestinal: Negative for abdominal pain, change in bowel habit, nausea and vomiting.  Neurological: Negative for difficulty with concentration, dizziness, focal weakness and headaches.  Psychiatric/Behavioral: Negative for altered mental status and suicidal ideas.  All other systems reviewed and are negative.     Objective:  Blood pressure (!) 137/104, pulse 78, temperature 97.8 F (36.6 C), height 4' 11.5" (1.511 m), weight 152 lb (68.9 kg), SpO2 100 %. Body mass index is 30.19 kg/m.    Physical Exam  Constitutional: She is oriented to person, place, and time. Vital signs are normal. She appears well-developed and well-nourished.  HENT:  Head: Normocephalic and  atraumatic.  Neck: Normal range of motion.  Cardiovascular: Normal rate, regular rhythm, normal heart sounds and intact distal pulses.  Pulmonary/Chest: Effort normal and breath sounds normal. No accessory muscle usage. No respiratory distress.  Abdominal: Soft. Bowel sounds are normal.  Musculoskeletal: Normal range of motion.  Neurological: She is alert and oriented to person, place, and time.  Skin: Skin is warm and dry.  Vitals reviewed.  Radiology: No results found.  Laboratory examination:   02/16/2018: WBC 4.1, CBC otherwise normal.  Creatinine 0.9, EGFR 77/93, potassium 4.2, CMP normal.  CMP Latest Ref Rng & Units 04/11/2016 08/09/2009 02/13/2009  Glucose 65 - 99 mg/dL 74 84 85  BUN 6 - 20 mg/dL '12 8 12  '$ Creatinine 0.57 - 1.00 mg/dL 0.63 0.71 0.7  Sodium 134 - 144 mmol/L 139 137 136  Potassium 3.5 - 5.2 mmol/L 3.6 4.5 3.6  Chloride 96 - 106 mmol/L 101 107 105  CO2 18 - 29 mmol/L 23 25 -  Calcium 8.7 - 10.2 mg/dL 9.1 9.2 -  Total Protein 6.0 - 8.3 g/dL - - -  Total Bilirubin 0.3 - 1.2 mg/dL - - -  Alkaline Phos 39 - 117 units/L - - -  AST 0 - 37 units/L - - -  ALT 0 - 35 units/L - - -   CBC Latest Ref Rng & Units 04/11/2016 04/05/2015 07/13/2013  WBC 3.4 - 10.8 x10E3/uL 4.4 3.7 11.7(H)  Hemoglobin 11.1 - 15.9 g/dL 13.1 11.8 11.8(L)  Hematocrit 34.0 - 46.6 % 38.9 36.9 33.7(L)  Platelets 150 - 379 x10E3/uL 210 201 128(L)   Lipid Panel     Component Value Date/Time   CHOL 183 06/02/2007 2029   TRIG 50 06/02/2007 2029   HDL 62 06/02/2007 2029   CHOLHDL 3.0 Ratio 06/02/2007 2029   VLDL 10 06/02/2007 2029   LDLCALC 111 (H) 06/02/2007 2029   HEMOGLOBIN A1C No results found for: HGBA1C, MPG TSH No results for input(s): TSH in the last 8760 hours.  PRN Meds:. There are no discontinued medications. Current Meds  Medication Sig  . aspirin EC 81 MG tablet Take 81 mg by mouth daily.  . Calcium Carbonate-Vit D-Min (CALCIUM 1200 PO) Take by mouth.  . loratadine (CLARITIN)  10 MG tablet Take 10 mg by mouth daily.  . MedroxyPROGESTERone Acetate (DEPO-PROVERA IM) Inject into the muscle.  . Multiple Vitamin (MULTIVITAMIN WITH MINERALS) TABS tablet Take 1 tablet by mouth at bedtime.     Cardiac Studies:     Assessment:   Atypical chest pain - Plan: EKG 12-Lead, PCV CARDIAC STRESS TEST, Lipid Profile  Essential hypertension  EKG 10/09/2018: Normal sinus rhythm at 76 bpm,  normal axis, no evidence of ischemia.  Recommendations:   Patient symptoms of chest pain symptoms suggestive of atypical chest pain, potentially related to esophageal spasm and musculoskeletal etiology as episodes not associated with exertion and very brief in duration.  Given that she does have some hypertension and has fairly strenuous job, will obtain routine treadmill stress testing for further evaluation.  No acute EKG abnormalities noted.  Physical exam is unremarkable.   For blood pressure, will start amlodipine 5 mg daily.  She is advised to contact us if she should become pregnant.  He has not had recent lipid profile, will obtain this for risk stratification.  Encouraged her to continue with primary prevention measures.  We will see her back for follow-up after the test.   *I have discussed this case with Dr. Virgina Jock and he personally examined the patient and participated in formulating the plan.*   Miquel Dunn, MSN, APRN, FNP-C [facility]. [place] Office: [phone] Fax: [phone]

## 2018-10-10 LAB — LIPID PANEL
Chol/HDL Ratio: 3.8 ratio (ref 0.0–4.4)
Cholesterol, Total: 165 mg/dL (ref 100–199)
HDL: 43 mg/dL (ref >39)
LDL Chol Calc (NIH): 112 mg/dL — ABNORMAL HIGH (ref 0–99)
Triglycerides: 49 mg/dL (ref 0–149)
VLDL Cholesterol Cal: 10 mg/dL (ref 5–40)

## 2018-10-23 ENCOUNTER — Other Ambulatory Visit: Payer: Self-pay

## 2018-10-23 DIAGNOSIS — Z20822 Contact with and (suspected) exposure to covid-19: Secondary | ICD-10-CM

## 2018-10-24 LAB — NOVEL CORONAVIRUS, NAA: SARS-CoV-2, NAA: NOT DETECTED

## 2018-11-04 ENCOUNTER — Other Ambulatory Visit: Payer: Self-pay

## 2018-11-04 DIAGNOSIS — Z20822 Contact with and (suspected) exposure to covid-19: Secondary | ICD-10-CM

## 2018-11-06 LAB — NOVEL CORONAVIRUS, NAA: SARS-CoV-2, NAA: NOT DETECTED

## 2018-11-09 ENCOUNTER — Ambulatory Visit (INDEPENDENT_AMBULATORY_CARE_PROVIDER_SITE_OTHER): Payer: BC Managed Care – PPO

## 2018-11-09 ENCOUNTER — Other Ambulatory Visit: Payer: Self-pay

## 2018-11-09 DIAGNOSIS — R0789 Other chest pain: Secondary | ICD-10-CM | POA: Diagnosis not present

## 2018-11-23 ENCOUNTER — Other Ambulatory Visit: Payer: Self-pay

## 2018-11-23 ENCOUNTER — Ambulatory Visit (INDEPENDENT_AMBULATORY_CARE_PROVIDER_SITE_OTHER): Payer: BC Managed Care – PPO | Admitting: Cardiology

## 2018-11-23 ENCOUNTER — Encounter: Payer: Self-pay | Admitting: Cardiology

## 2018-11-23 VITALS — BP 142/90 | HR 79 | Ht 59.5 in | Wt 152.0 lb

## 2018-11-23 DIAGNOSIS — I10 Essential (primary) hypertension: Secondary | ICD-10-CM

## 2018-11-23 DIAGNOSIS — R011 Cardiac murmur, unspecified: Secondary | ICD-10-CM

## 2018-11-23 DIAGNOSIS — R0789 Other chest pain: Secondary | ICD-10-CM

## 2018-11-23 MED ORDER — AMLODIPINE BESYLATE 10 MG PO TABS: 10.0000 mg | ORAL_TABLET | Freq: Every day | ORAL | 0 refills | Status: DC

## 2018-11-23 NOTE — Progress Notes (Signed)
Primary Physician:  Deland Pretty, MD   Patient ID: Rhonda Fisher, female    DOB: [birth year], 38 y.o.   MRN: [id number]  Subjective:    Chief Complaint  Patient presents with  . Chest Pain  . stress results    HPI: Rhonda Fisher  is a 38 y.o. female  with hypertension, recently evaluated by [place] for chest pain thought to be related to potential musculoskeletal or GERD etiology.  She was started on amlodipine as her blood pressure was markedly elevated.  She underwent treadmill stress testing and now presents for follow-up.  She has not noticed any episodes of chest pain since last seen by [place].  Her blood pressure has significantly improved, but she has not been checking on a regular basis.  No new complaints today.  No history of hyperlipidemia or diabetes.   She has not exercised regularly, but has walked 30 minutes over the summer without difficulty. No family history of heart disease.  Past Medical History:  Diagnosis Date  . Abnormal Pap smear    colpo wnl  . Hypertension    currently no meds  . Hypertension   . Seasonal allergies     Past Surgical History:  Procedure Laterality Date  . COLPOSCOPY    . HERNIA REPAIR    . HERNIA REPAIR      Social History   Socioeconomic History  . Marital status: Single    Spouse name: Not on file  . Number of children: 2  . Years of education: Not on file  . Highest education level: Not on file  Occupational History  . Not on file  Social Needs  . Financial resource strain: Not on file  . Food insecurity    Worry: Not on file    Inability: Not on file  . Transportation needs    Medical: Not on file    Non-medical: Not on file  Tobacco Use  . Smoking status: Former Smoker    Packs/day: 0.25    Types: Cigarettes    Quit date: 06/11/2011    Years since quitting: 7.4  . Smokeless tobacco: Never Used  Substance and Sexual Activity  . Alcohol use: Yes    Comment: occasionally  . Drug use: No  . Sexual activity: Not on  file  Lifestyle  . Physical activity    Days per week: Not on file    Minutes per session: Not on file  . Stress: Not on file  Relationships  . Social Herbalist on phone: Not on file    Gets together: Not on file    Attends religious service: Not on file    Active member of club or organization: Not on file    Attends meetings of clubs or organizations: Not on file    Relationship status: Not on file  . Intimate partner violence    Fear of current or ex partner: Not on file    Emotionally abused: Not on file    Physically abused: Not on file    Forced sexual activity: Not on file  Other Topics Concern  . Not on file  Social History Narrative  . Not on file    Review of Systems  Constitution: Negative for decreased appetite, malaise/fatigue, weight gain and weight loss.  Eyes: Negative for visual disturbance.  Cardiovascular: Negative for chest pain, claudication, dyspnea on exertion, leg swelling, orthopnea, palpitations and syncope.  Respiratory: Negative for hemoptysis and wheezing.   Endocrine:  Negative for cold intolerance and heat intolerance.  Hematologic/Lymphatic: Does not bruise/bleed easily.  Skin: Negative for nail changes.  Musculoskeletal: Negative for muscle weakness and myalgias.  Gastrointestinal: Negative for abdominal pain, change in bowel habit, nausea and vomiting.  Neurological: Negative for difficulty with concentration, dizziness, focal weakness and headaches.  Psychiatric/Behavioral: Negative for altered mental status and suicidal ideas.  All other systems reviewed and are negative.     Objective:  Blood pressure (!) 145/102, pulse 79, height 4' 11.5" (1.511 m), weight 152 lb 9.6 oz (69.2 kg), SpO2 100 %. Body mass index is 30.31 kg/m.    Physical Exam  Constitutional: She is oriented to person, place, and time. Vital signs are normal. She appears well-developed and well-nourished.  HENT:  Head: Normocephalic and atraumatic.  Neck:  Normal range of motion.  Cardiovascular: Normal rate, regular rhythm and intact distal pulses.  Murmur heard.  Early systolic murmur is present with a grade of 1/6 at the upper right sternal border. Pulmonary/Chest: Effort normal and breath sounds normal. No accessory muscle usage. No respiratory distress.  Abdominal: Soft. Bowel sounds are normal.  Musculoskeletal: Normal range of motion.  Neurological: She is alert and oriented to person, place, and time.  Skin: Skin is warm and dry.  Vitals reviewed.  Radiology: No results found.  Laboratory examination:   02/16/2018: WBC 4.1, CBC otherwise normal.  Creatinine 0.9, EGFR 77/93, potassium 4.2, CMP normal.  CMP Latest Ref Rng & Units 04/11/2016 08/09/2009 02/13/2009  Glucose 65 - 99 mg/dL 74 84 85  BUN 6 - 20 mg/dL '12 8 12  '$ Creatinine 0.57 - 1.00 mg/dL 0.63 0.71 0.7  Sodium 134 - 144 mmol/L 139 137 136  Potassium 3.5 - 5.2 mmol/L 3.6 4.5 3.6  Chloride 96 - 106 mmol/L 101 107 105  CO2 18 - 29 mmol/L 23 25 -  Calcium 8.7 - 10.2 mg/dL 9.1 9.2 -  Total Protein 6.0 - 8.3 g/dL - - -  Total Bilirubin 0.3 - 1.2 mg/dL - - -  Alkaline Phos 39 - 117 units/L - - -  AST 0 - 37 units/L - - -  ALT 0 - 35 units/L - - -   CBC Latest Ref Rng & Units 04/11/2016 04/05/2015 07/13/2013  WBC 3.4 - 10.8 x10E3/uL 4.4 3.7 11.7(H)  Hemoglobin 11.1 - 15.9 g/dL 13.1 11.8 11.8(L)  Hematocrit 34.0 - 46.6 % 38.9 36.9 33.7(L)  Platelets 150 - 379 x10E3/uL 210 201 128(L)   Lipid Panel     Component Value Date/Time   CHOL 165 10/09/2018 0941   TRIG 49 10/09/2018 0941   HDL 43 10/09/2018 0941   CHOLHDL 3.8 10/09/2018 0941   CHOLHDL 3.0 Ratio 06/02/2007 2029   VLDL 10 06/02/2007 2029   LDLCALC 112 (H) 10/09/2018 0941   HEMOGLOBIN A1C No results found for: HGBA1C, MPG TSH No results for input(s): TSH in the last 8760 hours.  PRN Meds:. There are no discontinued medications. Current Meds  Medication Sig  . amLODipine (NORVASC) 5 MG tablet Take 1 tablet  (5 mg total) by mouth daily.  . Ascorbic Acid (VITAMIN C PO) Take by mouth daily.  Marland Kitchen aspirin EC 81 MG tablet Take 81 mg by mouth daily.  . Calcium Carbonate-Vit D-Min (CALCIUM 1200 PO) Take by mouth. Every other day   . loratadine (CLARITIN) 10 MG tablet Take 10 mg by mouth daily.  Marland Kitchen MAGNESIUM PO Take by mouth. Twice a week  . MedroxyPROGESTERone Acetate (DEPO-PROVERA IM) Inject into the muscle.  Marland Kitchen  Multiple Vitamin (MULTIVITAMIN WITH MINERALS) TABS tablet Take 1 tablet by mouth. Every other day  . Multiple Vitamins-Minerals (ZINC PO) Take by mouth daily.  Marland Kitchen VITAMIN D PO Take by mouth daily.    Cardiac Studies:   Exercise treadmill stress test 11/09/2018: Exercise treadmill stress test performed using Bruce protocol.  Patient reached 10.3 METS, and 103% of age predicted maximum heart rate.  Exercise capacity was good. No chest pain reported. Normal heart rate and hemodynamic response.  Rest EKG showed sinus rhythm, nonspecific T wave inversion in lead III. Stress EKG shows sinus tachycardia, nonspecific T wave inversion in lead III. No ischemic changes seen. Normal exercise treadmill stress test.   Assessment:   Atypical chest pain  Essential hypertension  Systolic murmur  EKG 33/17/4099: Normal sinus rhythm at 76 bpm, normal axis, no evidence of ischemia.  Recommendations:   I discussed recently obtain treadmill stress testing that revealed an exercise capacity, no EKG changes suggestive of ischemia.  She has not had any further episodes of chest pain.  Continue to feel related to musculoskeletal or GERD etiology.  Her blood pressure has improved with addition of amlodipine, although was noted to be elevated in our office today.  I will further increase her amlodipine up to 10 mg daily.  She will continue with home monitoring and notify me in 2 weeks of her home blood pressure readings.  She is scheduled to see Dr. Shelia Media in January for follow-up as well.  In view of hypertension,  and also soft systolic aortic murmur on physical exam, will obtain echocardiogram to exclude any structural abnormalities. I will notify her of the results. Unless markedly abnormal, I will see her back on a as needed basis.   Miquel Dunn, MSN, APRN, FNP-C [facility]. [place] Office: [phone] Fax: [phone]

## 2018-11-24 ENCOUNTER — Encounter: Payer: Self-pay | Admitting: Cardiology

## 2018-12-01 ENCOUNTER — Other Ambulatory Visit: Payer: Self-pay

## 2018-12-01 ENCOUNTER — Ambulatory Visit (INDEPENDENT_AMBULATORY_CARE_PROVIDER_SITE_OTHER): Payer: BC Managed Care – PPO

## 2018-12-01 DIAGNOSIS — R011 Cardiac murmur, unspecified: Secondary | ICD-10-CM | POA: Diagnosis not present

## 2018-12-17 ENCOUNTER — Other Ambulatory Visit: Payer: Self-pay

## 2018-12-17 DIAGNOSIS — I10 Essential (primary) hypertension: Secondary | ICD-10-CM

## 2018-12-17 MED ORDER — AMLODIPINE BESYLATE 10 MG PO TABS: 10.0000 mg | ORAL_TABLET | Freq: Every day | ORAL | 0 refills | Status: DC

## 2018-12-18 ENCOUNTER — Other Ambulatory Visit: Payer: Self-pay

## 2018-12-18 DIAGNOSIS — I10 Essential (primary) hypertension: Secondary | ICD-10-CM

## 2018-12-18 MED ORDER — AMLODIPINE BESYLATE 10 MG PO TABS: 10.0000 mg | ORAL_TABLET | Freq: Every day | ORAL | 2 refills | Status: DC

## 2018-12-23 ENCOUNTER — Other Ambulatory Visit: Payer: Self-pay

## 2018-12-23 DIAGNOSIS — Z20822 Contact with and (suspected) exposure to covid-19: Secondary | ICD-10-CM

## 2018-12-23 DIAGNOSIS — I10 Essential (primary) hypertension: Secondary | ICD-10-CM

## 2018-12-23 MED ORDER — AMLODIPINE BESYLATE 10 MG PO TABS: 10.0000 mg | ORAL_TABLET | Freq: Every day | ORAL | 2 refills | Status: AC

## 2018-12-24 LAB — NOVEL CORONAVIRUS, NAA: SARS-CoV-2, NAA: NOT DETECTED

## 2019-01-14 ENCOUNTER — Other Ambulatory Visit: Payer: Self-pay | Admitting: Cardiology

## 2019-01-14 DIAGNOSIS — Z20822 Contact with and (suspected) exposure to covid-19: Secondary | ICD-10-CM

## 2019-01-16 LAB — NOVEL CORONAVIRUS, NAA: SARS-CoV-2, NAA: NOT DETECTED

## 2019-02-19 ENCOUNTER — Telehealth: Payer: Self-pay | Admitting: Hematology

## 2019-02-19 NOTE — Telephone Encounter (Signed)
Received a new hem referral from Dr. Renne Crigler for neutropenia. Pt has been cld and scheduled to see Dr. Candise Che on 1/26 at 11am. Pt has been made aware to arrive 15 minutes early.

## 2019-02-22 NOTE — Progress Notes (Signed)
HEMATOLOGY/ONCOLOGY CONSULTATION NOTE  Date of Service: 02/23/2019  Patient Care Team: Merri Brunette, MD as PCP - General (Internal Medicine)  CHIEF COMPLAINTS/PURPOSE OF CONSULTATION:  Neutropenia  HISTORY OF PRESENTING ILLNESS:   Rhonda Fisher is a wonderful 39 y.o. female who has been referred to [place] by Dr Renne Crigler for evaluation and management of neutropenia. Pt is accompanied today by her grandmother. The pt reports that she is doing well overall.   The pt reports that she has felt normal for the last 6 months. She was placed on Amlodipine for her HTN about 4 months ago. She has had no other significant medical concerns. Pt was on day 15 of a 21 day fast when she got her labs on 02/16/2019. She ended her fast on 02/21/2019. During this fast she was allowed to eat fruits and vegetables from 7 am to 7 pm and continued to drink water for the rest of the day. She missed her last appointment for her Depo-Provera injection. Pt has not seen any menstruation fluid but there could have been small amounts of bleeding.   Pt had a Herniorrhaphy in the early 2000's, this improved her reflux significantly. Pt denies any family history of blood disorders or auto immune conditions. She has no known allergies and no chemical exposure at work. She works in [employer] for [employer] and her children are currently learning from home. Pt does not take any OTC supplements. She has no history of kidney stones. She has not had any recent vaccinations and has had no infections requiring antibiotics in the last 6 months. Pt quit smoking in 2013 when she was pregnant with her first child.   Most recent lab results (02/22/2018) of CBC w/diff and CMP is as follows: all values are WNL  On review of systems, pt denies unexpected weight loss, fevers, chills, night sweats, cough, sore throat, mouth sores, abdominal pain, leg swelling, skin rashes and any other symptoms.   On PMHx the pt reports HTN,  Herniorrhaphy. On Social Hx the pt reports that she quit smoking in 2013.  MEDICAL HISTORY:  Past Medical History:  Diagnosis Date  . Abnormal Pap smear    colpo wnl  . Hypertension    currently no meds  . Hypertension   . Seasonal allergies     SURGICAL HISTORY: Past Surgical History:  Procedure Laterality Date  . COLPOSCOPY    . HERNIA REPAIR    . HERNIA REPAIR      SOCIAL HISTORY: Social History   Socioeconomic History  . Marital status: Single    Spouse name: Not on file  . Number of children: 2  . Years of education: Not on file  . Highest education level: Not on file  Occupational History  . Not on file  Tobacco Use  . Smoking status: Former Smoker    Packs/day: 0.25    Types: Cigarettes    Quit date: 06/11/2011    Years since quitting: 7.7  . Smokeless tobacco: Never Used  Substance and Sexual Activity  . Alcohol use: Yes    Comment: occasionally  . Drug use: No  . Sexual activity: Not on file  Other Topics Concern  . Not on file  Social History Narrative  . Not on file   Social Determinants of Health   Financial Resource Strain:   . Difficulty of Paying Living Expenses: Not on file  Food Insecurity:   . Worried About Programme researcher, broadcasting/film/video in the Last  Year: Not on file  . Ran Out of Food in the Last Year: Not on file  Transportation Needs:   . Lack of Transportation (Medical): Not on file  . Lack of Transportation (Non-Medical): Not on file  Physical Activity:   . Days of Exercise per Week: Not on file  . Minutes of Exercise per Session: Not on file  Stress:   . Feeling of Stress : Not on file  Social Connections:   . Frequency of Communication with Friends and Family: Not on file  . Frequency of Social Gatherings with Friends and Family: Not on file  . Attends Religious Services: Not on file  . Active Member of Clubs or Organizations: Not on file  . Attends Archivist Meetings: Not on file  . Marital Status: Not on file  Intimate  Partner Violence:   . Fear of Current or Ex-Partner: Not on file  . Emotionally Abused: Not on file  . Physically Abused: Not on file  . Sexually Abused: Not on file    FAMILY HISTORY: Family History  Problem Relation Age of Onset  . Hypertension Mother   . Diabetes Mother   . Hypertension Father   . Diabetes Father   . ADD / ADHD Brother   . Aneurysm Maternal Grandmother   . Hypertension Paternal Grandmother   . Diabetes Paternal Grandmother   . Arthritis Paternal Grandmother   . Diabetes Sister   . Hypertension Sister   . Anesthesia problems Neg Hx     ALLERGIES:  has No Known Allergies.  MEDICATIONS:  Current Outpatient Medications  Medication Sig Dispense Refill  . amLODipine (NORVASC) 10 MG tablet Take 1 tablet (10 mg total) by mouth daily. 90 tablet 2  . Ascorbic Acid (VITAMIN C PO) Take by mouth daily.    Marland Kitchen aspirin EC 81 MG tablet Take 81 mg by mouth daily.    . Calcium Carbonate-Vit D-Min (CALCIUM 1200 PO) Take by mouth. Every other day     . loratadine (CLARITIN) 10 MG tablet Take 10 mg by mouth daily.    Marland Kitchen MAGNESIUM PO Take by mouth. Twice a week    . MedroxyPROGESTERone Acetate (DEPO-PROVERA IM) Inject into the muscle.    . Multiple Vitamin (MULTIVITAMIN WITH MINERALS) TABS tablet Take 1 tablet by mouth. Every other day    . Multiple Vitamins-Minerals (ZINC PO) Take by mouth daily.    Marland Kitchen VITAMIN D PO Take by mouth daily.    . clotrimazole-betamethasone (LOTRISONE) cream      No current facility-administered medications for this visit.    REVIEW OF SYSTEMS:    10 Point review of Systems was done is negative except as noted above.  PHYSICAL EXAMINATION: ECOG PERFORMANCE STATUS: 0 - Asymptomatic  . Vitals:   02/23/19 1119  BP: 138/85  Pulse: 100  Resp: 18  Temp: 97.8 F (36.6 C)  SpO2: 100%   Filed Weights   02/23/19 1119  Weight: 143 lb 6.4 oz (65 kg)   .Body mass index is 28.48 kg/m.  GENERAL:alert, in no acute distress and  comfortable SKIN: no acute rashes, no significant lesions EYES: conjunctiva are pink and non-injected, sclera anicteric OROPHARYNX: MMM, no exudates, no oropharyngeal erythema or ulceration NECK: supple, no JVD LYMPH:  no palpable lymphadenopathy in the cervical, axillary or inguinal regions LUNGS: clear to auscultation b/l with normal respiratory effort HEART: regular rate & rhythm ABDOMEN:  normoactive bowel sounds , non tender, not distended. Extremity: no pedal edema PSYCH: alert &  oriented x 3 with fluent speech NEURO: no focal motor/sensory deficits  LABORATORY DATA:  I have reviewed the data as listed  . CBC Latest Ref Rng & Units 02/23/2019 04/11/2016 04/05/2015  WBC 4.0 - 10.5 K/uL 4.4 4.4 3.7  Hemoglobin 12.0 - 15.0 g/dL 74.1 28.7 86.7  Hematocrit 36.0 - 46.0 % 40.7 38.9 36.9  Platelets 150 - 400 K/uL 166 210 201   ANC 3k  CBC    Component Value Date/Time   WBC 4.4 02/23/2019 1255   RBC 4.47 02/23/2019 1255   HGB 13.6 02/23/2019 1255   HGB 13.1 04/11/2016 1539   HCT 40.7 02/23/2019 1255   HCT 38.9 04/11/2016 1539   PLT 166 02/23/2019 1255   PLT 210 04/11/2016 1539   MCV 91.1 02/23/2019 1255   MCV 92 04/11/2016 1539   MCH 30.4 02/23/2019 1255   MCHC 33.4 02/23/2019 1255   RDW 12.1 02/23/2019 1255   RDW 12.9 04/11/2016 1539   LYMPHSABS 1.1 02/23/2019 1255   MONOABS 0.3 02/23/2019 1255   EOSABS 0.0 02/23/2019 1255   BASOSABS 0.0 02/23/2019 1255    . CMP Latest Ref Rng & Units 02/23/2019 04/11/2016 08/09/2009  Glucose 70 - 99 mg/dL 672(C) 74 84  BUN 6 - 20 mg/dL 10 12 8   Creatinine 0.44 - 1.00 mg/dL 9.47 0.96  Sodium 135 - 145 mmol/L 141 139 137  Potassium 3.5 - 5.1 mmol/L 3.7 3.6 4.5  Chloride 98 - 111 mmol/L 109 101 107  CO2 22 - 32 mmol/L 22 23 25   Calcium 8.9 - 10.3 mg/dL 9.1 9.1 9.2  Total Protein 6.5 - 8.1 g/dL 8.0 - -  Total Bilirubin 0.3 - 1.2 mg/dL 0.3 - -  Alkaline Phos 38 - 126 U/L 65 - -  AST 15 - 41 U/L 19 - -  ALT 0 - 44 U/L 27 - -      RADIOGRAPHIC STUDIES: I have personally reviewed the radiological images as listed and agreed with the findings in the report. No results found.  ASSESSMENT & PLAN:   39 yo with   1) Isolated Neutropenia. Resolved on rpt labslikely spurious lab results vs temporary changes due to patient fasting. PLAN: -Discussed patient's most recent labs from 02/22/2018, all values are WNL -Discussed 02/15/2018 CBC w/diff and CMP is as follows: all values are WNL except for WBC at 2.4K, Neutro Rel at 31.9, Mono Rel at 15.9. -No clinical symptoms, no significant exam findings -Labs from today show pt's neutropenia has corrected  -Advised pt that if her neutropenia was persistent or progressive that would be more concerning -Bone marrow examination not indicated as other blood counts are normal  -Advised pt that neutropenia could be due to medication, infection, or vitamin deficiency. or her fasting. -Neutropenia could also be due to a spurious lab  -Advised pt that there could be a change in WBC distribution due to certain fasts  -Will get labs today  -Will see back as needed    FOLLOW UP: Labs today RTC with Dr 02/24/2018 as needed based on labs  All of the patients questions were answered with apparent satisfaction. The patient knows to call the clinic with any problems, questions or concerns.  I spent 25 mins counseling the patient face to face. The total time spent in the appointment was 35 minutes and more than 50% was on counseling and direct patient cares.    02/17/2018 MD MS AAHIVMS [facility] Hematology/Oncology Physician [facility]  (Office):  [phone] (Work cell):  [phone] (Fax):           [phone]  02/23/2019 4:44 PM  I, Carollee Herter, am acting as a scribe for Dr. Wyvonnia Lora.   .I have reviewed the above documentation for accuracy and completeness, and I agree with the above. Johney Maine MD

## 2019-02-23 ENCOUNTER — Inpatient Hospital Stay: Payer: BC Managed Care – PPO | Attending: Hematology | Admitting: Hematology

## 2019-02-23 ENCOUNTER — Encounter: Payer: Self-pay | Admitting: Hematology

## 2019-02-23 ENCOUNTER — Inpatient Hospital Stay: Payer: BC Managed Care – PPO

## 2019-02-23 ENCOUNTER — Other Ambulatory Visit: Payer: Self-pay

## 2019-02-23 VITALS — BP 138/85 | HR 100 | Temp 97.8°F | Resp 18 | Ht 59.5 in | Wt 143.4 lb

## 2019-02-23 DIAGNOSIS — D709 Neutropenia, unspecified: Secondary | ICD-10-CM | POA: Insufficient documentation

## 2019-02-23 LAB — CMP (CANCER CENTER ONLY)
ALT: 27 U/L (ref 0–44)
AST: 19 U/L (ref 15–41)
Albumin: 4.6 g/dL (ref 3.5–5.0)
Alkaline Phosphatase: 65 U/L (ref 38–126)
Anion gap: 10 (ref 5–15)
BUN: 10 mg/dL (ref 6–20)
CO2: 22 mmol/L (ref 22–32)
Calcium: 9.1 mg/dL (ref 8.9–10.3)
Chloride: 109 mmol/L (ref 98–111)
Creatinine: 0.86 mg/dL (ref 0.44–1.00)
GFR, Est AFR Am: >60 mL/min (ref >60)
GFR, Estimated: >60 mL/min (ref >60)
Glucose, Bld: 103 mg/dL — ABNORMAL HIGH (ref 70–99)
Potassium: 3.7 mmol/L (ref 3.5–5.1)
Sodium: 141 mmol/L (ref 135–145)
Total Bilirubin: 0.3 mg/dL (ref 0.3–1.2)
Total Protein: 8 g/dL (ref 6.5–8.1)

## 2019-02-23 LAB — CBC WITH DIFFERENTIAL/PLATELET
Abs Immature Granulocytes: 0.01 10*3/uL (ref 0.00–0.07)
Basophils Absolute: 0 10*3/uL (ref 0.0–0.1)
Basophils Relative: 1 %
Eosinophils Absolute: 0 10*3/uL (ref 0.0–0.5)
Eosinophils Relative: 0 %
HCT: 40.7 % (ref 36.0–46.0)
Hemoglobin: 13.6 g/dL (ref 12.0–15.0)
Immature Granulocytes: 0 %
Lymphocytes Relative: 25 %
Lymphs Abs: 1.1 10*3/uL (ref 0.7–4.0)
MCH: 30.4 pg (ref 26.0–34.0)
MCHC: 33.4 g/dL (ref 30.0–36.0)
MCV: 91.1 fL (ref 80.0–100.0)
Monocytes Absolute: 0.3 10*3/uL (ref 0.1–1.0)
Monocytes Relative: 6 %
Neutro Abs: 3 10*3/uL (ref 1.7–7.7)
Neutrophils Relative %: 68 %
Platelets: 166 10*3/uL (ref 150–400)
RBC: 4.47 MIL/uL (ref 3.87–5.11)
RDW: 12.1 % (ref 11.5–15.5)
WBC: 4.4 10*3/uL (ref 4.0–10.5)
nRBC: 0 % (ref 0.0–0.2)

## 2019-02-23 LAB — VITAMIN B12: Vitamin B-12: 620 pg/mL (ref 180–914)

## 2019-02-23 LAB — LACTATE DEHYDROGENASE: LDH: 135 U/L (ref 98–192)

## 2019-02-23 LAB — SEDIMENTATION RATE: Sed Rate: 13 mm/hr (ref 0–22)

## 2019-11-19 ENCOUNTER — Other Ambulatory Visit: Payer: Self-pay

## 2019-11-19 ENCOUNTER — Other Ambulatory Visit: Payer: BC Managed Care – PPO

## 2019-11-19 DIAGNOSIS — Z20822 Contact with and (suspected) exposure to covid-19: Secondary | ICD-10-CM

## 2019-11-20 LAB — SARS-COV-2, NAA 2 DAY TAT

## 2019-11-20 LAB — NOVEL CORONAVIRUS, NAA: SARS-CoV-2, NAA: NOT DETECTED

## 2020-01-14 ENCOUNTER — Other Ambulatory Visit: Payer: BC Managed Care – PPO

## 2020-01-14 DIAGNOSIS — Z20822 Contact with and (suspected) exposure to covid-19: Secondary | ICD-10-CM

## 2020-01-15 LAB — SARS-COV-2, NAA 2 DAY TAT

## 2020-01-15 LAB — NOVEL CORONAVIRUS, NAA: SARS-CoV-2, NAA: NOT DETECTED

## 2020-03-06 ENCOUNTER — Other Ambulatory Visit: Payer: Self-pay | Admitting: Cardiology

## 2020-03-06 DIAGNOSIS — I10 Essential (primary) hypertension: Secondary | ICD-10-CM

## 2020-07-06 ENCOUNTER — Other Ambulatory Visit: Payer: Self-pay | Admitting: Internal Medicine

## 2020-07-06 DIAGNOSIS — Z1231 Encounter for screening mammogram for malignant neoplasm of breast: Secondary | ICD-10-CM

## 2020-09-04 ENCOUNTER — Ambulatory Visit
Admission: RE | Admit: 2020-09-04 | Discharge: 2020-09-04 | Disposition: A | Discharge status: Home / Self Care | Payer: BC Managed Care – PPO | Source: Ambulatory Visit | Attending: Internal Medicine | Admitting: Internal Medicine

## 2020-09-04 ENCOUNTER — Other Ambulatory Visit: Payer: Self-pay

## 2020-09-04 DIAGNOSIS — Z1231 Encounter for screening mammogram for malignant neoplasm of breast: Secondary | ICD-10-CM

## 2021-05-07 ENCOUNTER — Ambulatory Visit (INDEPENDENT_AMBULATORY_CARE_PROVIDER_SITE_OTHER): Payer: BC Managed Care – PPO | Admitting: Podiatry

## 2021-05-07 ENCOUNTER — Other Ambulatory Visit: Payer: Self-pay | Admitting: Podiatry

## 2021-05-07 DIAGNOSIS — B351 Tinea unguium: Secondary | ICD-10-CM

## 2021-05-07 MED ORDER — TERBINAFINE HCL 250 MG PO TABS: 250.0000 mg | ORAL_TABLET | Freq: Every day | ORAL | 0 refills | Status: AC

## 2021-05-08 LAB — HEPATIC FUNCTION PANEL
ALT: 20 IU/L (ref 0–32)
AST: 17 IU/L (ref 0–40)
Albumin: 4.5 g/dL (ref 3.8–4.8)
Alkaline Phosphatase: 82 IU/L (ref 44–121)
Bilirubin Total: 0.4 mg/dL (ref 0.0–1.2)
Bilirubin, Direct: 0.1 mg/dL (ref 0.00–0.40)
Total Protein: 7.6 g/dL (ref 6.0–8.5)

## 2021-05-08 NOTE — Progress Notes (Signed)
Subjective:  ? ?Patient ID: Rhonda Fisher, female   DOB: 41 y.o.   MRN: [id number]  ? ?HPI ?Patient presents concerned about some itchiness between her digits and some dry skin and also thick toenails.  States that they have become more bothersome over the last few months and patient does not smoke likes to be active ? ? ?Review of Systems  ?All other systems reviewed and are negative. ? ? ?   ?Objective:  ?Physical Exam ?Vitals and nursing note reviewed.  ?Constitutional:   ?   Appearance: She is well-developed.  ?Pulmonary:  ?   Effort: Pulmonary effort is normal.  ?Musculoskeletal:     ?   General: Normal range of motion.  ?Skin: ?   General: Skin is warm.  ?Neurological:  ?   Mental Status: She is alert.  ?  ?Neurovascular status found to be intact muscle strength found to be adequate range of motion adequate.  Patient is noted to have thick toenails with yellow discoloration and irritation between the lesser digits of the right foot with white light discoloration.  Good digital perfusion well oriented x3 ? ?   ?Assessment:  ?Probability for fungal infection of skin and nailbeds ? ?   ?Plan:  ?H&P education concerning this and treatment options.  She wants an aggressive conservative treatment I have recommended oral medication and I am placing her on Lamisil and I am sending for liver function studies.  Educated her on risk of Lamisil she wants to pursue this course and will start oral medication and will be seen back depending on response ?   ? ? ?

## 2021-05-27 ENCOUNTER — Encounter (HOSPITAL_COMMUNITY): Payer: Self-pay

## 2021-05-27 ENCOUNTER — Ambulatory Visit (HOSPITAL_COMMUNITY)
Admission: EM | Admit: 2021-05-27 | Discharge: 2021-05-27 | Disposition: A | Discharge status: Home / Self Care | Payer: BC Managed Care – PPO | Attending: Internal Medicine | Admitting: Internal Medicine

## 2021-05-27 DIAGNOSIS — S70362A Insect bite (nonvenomous), left thigh, initial encounter: Secondary | ICD-10-CM

## 2021-05-27 DIAGNOSIS — W57XXXA Bitten or stung by nonvenomous insect and other nonvenomous arthropods, initial encounter: Secondary | ICD-10-CM

## 2021-05-27 DIAGNOSIS — L03116 Cellulitis of left lower limb: Secondary | ICD-10-CM

## 2021-05-27 MED ORDER — IBUPROFEN 800 MG PO TABS
ORAL_TABLET | ORAL | Status: AC
  Filled 2021-05-27: qty 1

## 2021-05-27 MED ORDER — IBUPROFEN 600 MG PO TABS: 600.0000 mg | ORAL_TABLET | Freq: Four times a day (QID) | ORAL | 0 refills | Status: AC | PRN

## 2021-05-27 MED ORDER — DOXYCYCLINE HYCLATE 100 MG PO CAPS: 100.0000 mg | ORAL_CAPSULE | Freq: Two times a day (BID) | ORAL | 0 refills | Status: DC

## 2021-05-27 MED ORDER — IBUPROFEN 800 MG PO TABS
800.0000 mg | ORAL_TABLET | Freq: Once | ORAL | Status: AC
  Administered 2021-05-27: 800 mg via ORAL

## 2021-05-27 NOTE — Discharge Instructions (Addendum)
You were seen in urgent care today for your cellulitis due to an insect bite.  This appears to be very infected today.  I have prescribed ibuprofen for your pain to be taken with food every 6 hours.  I have also prescribed doxycycline antibiotic for you to take for 7 days twice daily with food. Continue to apply warm compresses to your wound.  ? ?Please follow-up with your primary care provider regarding your insect bite.  If you do not see improvement in your symptoms or if you develop any new or worsening symptoms, please return to urgent care.  Please go to the emergency room if your symptoms are severe. ?I hope it feels better soon! ?

## 2021-05-27 NOTE — ED Provider Notes (Signed)
?[facility] ? ? ? ?CSN: [id number] ?Arrival date & time: 05/27/21  1143 ? ? ?  ? ?History   ?Chief Complaint ?Chief Complaint  ?Patient presents with  ? Insect Bite  ? ? ?HPI ?Rhonda Fisher is a 41 y.o. female.  ? ?Patient presents urgent care for evaluation of her insect bite that she sustained to her right inner thigh on Friday night 2 days ago.  She noticed itchiness to her right inner thigh at first, but on Saturday an area of redness grew around the bite.  Today, she woke up and the area of redness significantly worsened overnight and grew even larger and her pain became significantly worse.  Initially, she cleansed the wound with hydrogen peroxide and then attempted to prevent infection.  She states that the pain is so significant that it is disrupting her sleep at night and the wound is very painful to the touch.  She denies fever and drainage from the wound.  Denies nausea, vomiting, heart palpitations, and dizziness.  She states that she has sustained insect bites in the past and that she usually is able to wait until they "come to a head after warm compresses then pop them herself".  This is the first time that she has ever had an insect bite become infected.  Denies any other aggravating or relieving factors.  She has not taken any medication for her pain at this time. ? ? ? ?Past Medical History:  ?Diagnosis Date  ? Abnormal Pap smear   ? colpo wnl  ? Hypertension   ? currently no meds  ? Hypertension   ? Seasonal allergies   ? ? ?Patient Active Problem List  ? Diagnosis Date Noted  ? Seasonal and perennial allergic rhinitis 07/07/2018  ? 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 Visit for TB skin test 07/18/2016  ? Abnormal EKG 04/11/2016  ? Dermatitis 04/11/2016  ? Healthcare maintenance 03/03/2015  ? Forehead laceration 03/03/2015  ? ? ?Past Surgical History:  ?Procedure Laterality Date  ? COLPOSCOPY    ? HERNIA REPAIR    ? HERNIA REPAIR    ? ? ?OB History   ? ? Gravida  ?2  ? Para  ?2  ?  Term  ?2  ? Preterm  ?0  ? AB  ?0  ? Living  ?2  ?  ? ? SAB  ?0  ? IAB  ?0  ? Ectopic  ?0  ? Multiple  ?0  ? Live Births  ?2  ?   ?  ?  ? ? ? ?Home Medications   ? ?Prior to Admission medications   ?Medication Sig Start Date End Date Taking? Authorizing Provider  ?doxycycline (VIBRAMYCIN) 100 MG capsule Take 1 capsule (100 mg total) by mouth 2 (two) times daily. 05/27/21  Yes Carlisle Beers, FNP  ?ibuprofen (ADVIL) 600 MG tablet Take 1 tablet (600 mg total) by mouth every 6 (six) hours as needed. 05/27/21  Yes Carlisle Beers, FNP  ?amLODipine (NORVASC) 10 MG tablet Take 1 tablet (10 mg total) by mouth daily. 12/23/18 03/23/19  Yates Decamp, MD  ?Ascorbic Acid (VITAMIN C PO) Take by mouth daily.    [provider]  ?aspirin EC 81 MG tablet Take 81 mg by mouth daily.    [provider]  ?Calcium Carbonate-Vit D-Min (CALCIUM 1200 PO) Take by mouth. Every other day     [provider]  ?clotrimazole-betamethasone (LOTRISONE) cream  02/13/19   [provider]  ?  loratadine (CLARITIN) 10 MG tablet Take 10 mg by mouth daily.    [provider]  ?MAGNESIUM PO Take by mouth. Twice a week    [provider]  ?MedroxyPROGESTERone Acetate (DEPO-PROVERA IM) Inject into the muscle.    [provider]  ?Multiple Vitamin (MULTIVITAMIN WITH MINERALS) TABS tablet Take 1 tablet by mouth. Every other day    [provider]  ?Multiple Vitamins-Minerals (ZINC PO) Take by mouth daily.    [provider]  ?terbinafine (LAMISIL) 250 MG tablet Take 1 tablet (250 mg total) by mouth daily. 05/07/21   Lenn Sink, DPM  ?VITAMIN D PO Take by mouth daily.    [provider]  ? ? ?Family History ?Family History  ?Problem Relation Age of Onset  ? Hypertension Mother   ? Diabetes Mother   ? Hypertension Father   ? Diabetes Father   ? Diabetes Sister   ? Hypertension Sister   ? Breast cancer Paternal Aunt   ? Aneurysm Maternal Grandmother   ?  Hypertension Paternal Grandmother   ? Diabetes Paternal Grandmother   ? Arthritis Paternal Grandmother   ? ADD / ADHD Brother   ? Anesthesia problems Neg Hx   ? ? ?Social History ?Social History  ? ?Tobacco Use  ? Smoking status: Former  ?  Packs/day: 0.25  ?  Types: Cigarettes  ?  Quit date: 06/11/2011  ?  Years since quitting: 9.9  ? Smokeless tobacco: Never  ?Vaping Use  ? Vaping Use: Never used  ?Substance Use Topics  ? Alcohol use: Yes  ?  Comment: occasionally  ? Drug use: No  ? ? ? ?Allergies   ?Patient has no known allergies. ? ? ?Review of Systems ?Review of Systems ?Per HPI ? ?Physical Exam ?Triage Vital Signs ?ED Triage Vitals  ?Enc Vitals Group  ?   BP 05/27/21 1345 (!) 149/83  ?   Pulse Rate 05/27/21 1345 81  ?   Resp 05/27/21 1345 18  ?   Temp 05/27/21 1345 98.4 ?F (36.9 ?C)  ?   Temp Source 05/27/21 1345 Oral  ?   SpO2 05/27/21 1345 100 %  ?   Weight --   ?   Height --   ?   Head Circumference --   ?   Peak Flow --   ?   Pain Score 05/27/21 1346 9  ?   Pain Loc --   ?   Pain Edu? --   ?   Excl. in GC? --   ? ?No data found. ? ?Updated Vital Signs ?BP (!) 149/83 (BP Location: Right Arm)   Pulse 81   Temp 98.4 ?F (36.9 ?C) (Oral)   Resp 18   SpO2 100%  ? ?Visual Acuity ?Right Eye Distance:   ?Left Eye Distance:   ?Bilateral Distance:   ? ?Right Eye Near:   ?Left Eye Near:    ?Bilateral Near:    ? ?Physical Exam ?Vitals and nursing note reviewed.  ?Constitutional:   ?   General: She is not in acute distress. ?   Appearance: Normal appearance. She is well-developed. She is not ill-appearing.  ?HENT:  ?   Head: Normocephalic and atraumatic.  ?   Right Ear: Tympanic membrane, ear canal and external ear normal.  ?   Left Ear: Tympanic membrane, ear canal and external ear normal.  ?   Nose: Nose normal.  ?   Mouth/Throat:  ?   Mouth: Mucous membranes are moist.  ?  Eyes:  ?   Extraocular Movements: Extraocular movements intact.  ?   Conjunctiva/sclera: Conjunctivae normal.  ?Cardiovascular:  ?   Rate and  Rhythm: Normal rate and regular rhythm.  ?   Heart sounds: Normal heart sounds. No murmur heard. ?  No friction rub. No gallop.  ?Pulmonary:  ?   Effort: Pulmonary effort is normal. No respiratory distress.  ?   Breath sounds: Normal breath sounds. No wheezing, rhonchi or rales.  ?Chest:  ?   Chest wall: No tenderness.  ?Abdominal:  ?   Palpations: Abdomen is soft.  ?   Tenderness: There is no abdominal tenderness. There is no right CVA tenderness or left CVA tenderness.  ?Musculoskeletal:     ?   General: No swelling. Normal range of motion.  ?   Cervical back: Neck supple.  ?Skin: ?   General: Skin is warm and dry.  ?   Capillary Refill: Capillary refill takes less than 2 seconds.  ?   Findings: No rash.  ?   Comments: Area of erythema consistent with cellulitis to patient's right medial inner thigh that is approximately 5 to 6 inches in diameter.  Area is very tender to palpation.  No abscess felt underneath the skin.  Visible bite noted on physical exam that is white and raised with surrounding area of erythema.  Patient is afebrile and there is no swelling present.  Patient is nontender to palpation of nonerythematous areas of her right thigh.  Range of motion intact.  Sensation intact and she is vascularly intact as well bilaterally.   ?Neurological:  ?   General: No focal deficit present.  ?   Mental Status: She is alert and oriented to person, place, and time.  ?Psychiatric:     ?   Mood and Affect: Mood normal.     ?   Behavior: Behavior normal.     ?   Thought Content: Thought content normal.     ?   Judgment: Judgment normal.  ? ? ? ?UC Treatments / Results  ?Labs ?(all labs ordered are listed, but only abnormal results are displayed) ?Labs Reviewed - No data to display ? ?EKG ? ? ?Radiology ?No results found. ? ?Procedures ?Procedures (including critical care time) ? ?Medications Ordered in UC ?Medications  ?ibuprofen (ADVIL) tablet 800 mg (800 mg Oral Given 05/27/21 1548)  ? ? ?Initial Impression /  Assessment and Plan / UC Course  ?I have reviewed the triage vital signs and the nursing notes. ? ?Pertinent labs & imaging results that were available during my care of the patient were reviewed by me and considered in my

## 2021-05-27 NOTE — ED Triage Notes (Addendum)
Pt presents with insect bite on back of right leg that is very painful, swollen and red. ?

## 2021-11-02 ENCOUNTER — Ambulatory Visit (INDEPENDENT_AMBULATORY_CARE_PROVIDER_SITE_OTHER): Payer: BC Managed Care – PPO | Admitting: Internal Medicine

## 2021-11-02 ENCOUNTER — Encounter: Payer: Self-pay | Admitting: Internal Medicine

## 2021-11-02 VITALS — BP 136/90 | HR 92 | Temp 97.9°F | Resp 18 | Ht 59.0 in | Wt 171.2 lb

## 2021-11-02 DIAGNOSIS — K219 Gastro-esophageal reflux disease without esophagitis: Secondary | ICD-10-CM

## 2021-11-02 DIAGNOSIS — J310 Chronic rhinitis: Secondary | ICD-10-CM | POA: Diagnosis not present

## 2021-11-02 NOTE — Patient Instructions (Addendum)
Rhinitis: - Hold all anti histamines until next visit.   GERD: -Avoid lying down for at least two hours after a meal or after drinking acidic beverages, like soda, or other caffeinated beverages. This can help to prevent stomach contents from flowing back into the esophagus. -Keep your head elevated while you sleep. Using an extra pillow or two can also help to prevent reflux. -Eat smaller and more frequent meals each day instead of a few large meals. This promotes digestion and can aid in preventing heartburn. -Wear loose-fitting clothes to ease pressure on the stomach, which can worsen heartburn and reflux. -Avoid foods that increase the level of acid in your stomach, including caffeinated beverages. -Avoid foods that decrease the pressure in the lower esophagus, such as fatty foods, alcohol and peppermint. -Avoid foods that affect peristalsis (the muscle movements in your digestive tract), such as coffee, alcohol and acidic liquids.

## 2021-11-02 NOTE — Progress Notes (Signed)
NEW PATIENT  Date of Service/Encounter:  11/02/21  Consult requested by: Merri Brunette, MD   Subjective:   Rhonda Fisher (DOB: 30-Apr-1980) is a 41 y.o. female who presents to the clinic on 11/02/2021 with a chief complaint of Seasonal and Perennial Allergic Rhinitis (LOV: 07/07/18 Patient states Zyrtec is doing okay. ) .    History obtained from: chart review and patient.   Rhinitis:  It has been ongoing for years and her symptoms are uncontrolled so she is hoping to re-establish care. Symptoms include:  itchy mouth, nasal congestion, rhinorrhea, post nasal drainage, sneezing, and itchy nose  Occurs year-round Potential triggers: pollen Treatments tried: Zyrtec 10mg  daily and PRN saline rinses  Previous allergy testing: yes; she was seen in 2020 and was allergy tested and was positive to weeds, grasses, trees, molds, DM, cat History of chronic sinusitis or sinus surgery: no  GERD: She has a lot of trouble with reflux also.  Reports having a lot of burning and acidic taste in her mouth after eating certain foods like pasta sauce, pizza etc. Sometimes the reflux is so bad that it lasts for several days out of the week.  She does take a baby aspirin as she was told by her grandmother to do so to help thin out her blood.  She does not have any cardiac disease.  She does have HTN controlled with Amlodipine.    Past Medical History: Past Medical History:  Diagnosis Date   Abnormal Pap smear    colpo wnl   Hypertension    currently no meds   Hypertension    Seasonal allergies    Past Surgical History: Past Surgical History:  Procedure Laterality Date   COLPOSCOPY     HERNIA REPAIR     HERNIA REPAIR      Family History: Family History  Problem Relation Age of Onset   Hypertension Mother    Diabetes Mother    Hypertension Father    Diabetes Father    Diabetes Sister    Hypertension Sister    Breast cancer Paternal Aunt    Aneurysm Maternal Grandmother    Hypertension  Paternal Grandmother    Diabetes Paternal Grandmother    Arthritis Paternal Grandmother    ADD / ADHD Brother    Anesthesia problems Neg Hx     Social History:  Lives in a 1967 year house Flooring in bedroom: wood Pets: dog Tobacco use/exposure: none 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 If you have any questions, ask your nurse or doctor.          STOP taking these medications    doxycycline 100 MG capsule Commonly known as: VIBRAMYCIN Stopped by: November 04, 2021, MD       TAKE these medications    amLODipine 10 MG tablet Commonly known as: NORVASC Take 1 tablet (10 mg total) by mouth daily.   aspirin EC 81 MG tablet Take 81 mg by mouth daily.   CALCIUM 1200 PO Take by mouth. Every other day   cetirizine 10 MG tablet Commonly known as: ZYRTEC Take 10 mg by mouth daily.   clotrimazole-betamethasone cream Commonly known as: LOTRISONE   DEPO-PROVERA IM Inject into the muscle.   ibuprofen 600 MG tablet Commonly known as: ADVIL Take 1 tablet (600 mg  total) by mouth every 6 (six) hours as needed.   loratadine 10 MG tablet Commonly known as: CLARITIN Take 10 mg by mouth daily.   MAGNESIUM PO Take by mouth. Twice a week   multivitamin with minerals Tabs tablet Take 1 tablet by mouth. Every other day   terbinafine 250 MG tablet Commonly known as: LamISIL Take 1 tablet (250 mg total) by mouth daily.   VITAMIN C PO Take by mouth daily.   VITAMIN D PO Take by mouth daily.   ZINC PO Take by mouth daily.         REVIEW OF SYSTEMS: Pertinent positives and negatives discussed in HPI.   Objective:   Physical Exam: BP (!) 136/90   Pulse 92   Temp 97.9 F (36.6 C)   Resp 18   Ht 4\' 11"  (1.499 m)   Wt 171 lb 3.2 oz (77.7 kg)   SpO2 100%   BMI 34.58 kg/m  Body mass index is 34.58 kg/m. GEN: alert, well  developed HEENT: clear conjunctiva, TM grey and translucent, nose with + inferior turbinate hypertrophy, pale nasal mucosa, slight clear rhinorrhea, + cobblestoning HEART: regular rate and rhythm, no murmur LUNGS: clear to auscultation bilaterally, no coughing, unlabored respiration ABDOMEN: soft, non distended  SKIN: no rashes or lesions  Reviewed:  Note from 2020 with perennial and seasonal allergic rhinitis, positive SPT to pollens, mold, DM, cat and was started on PRN Zyrtec   Assessment:   1. Chronic rhinitis     Plan/Recommendations:   Chronic Rhinitis, Uncontrolled - Likely allergic symptoms based on history and with worsening and uncontrolled sxs, recommend repeat testing - Hold all anti histamines until next visit. - Will discuss nasal sprays at next visit based on testing.  GERD, Uncontrolled - Hold ASA in setting of uncontrolled GERD and no significant risk factors - Can start PRN Pepcid 20mg  for GERD after skin testing. -Avoid lying down for at least two hours after a meal or after drinking acidic beverages, like soda, or other caffeinated beverages. This can help to prevent stomach contents from flowing back into the esophagus. -Keep your head elevated while you sleep. Using an extra pillow or two can also help to prevent reflux. -Eat smaller and more frequent meals each day instead of a few large meals. This promotes digestion and can aid in preventing heartburn. -Wear loose-fitting clothes to ease pressure on the stomach, which can worsen heartburn and reflux. -Avoid foods that increase the level of acid in your stomach, including caffeinated beverages. -Avoid foods that decrease the pressure in the lower esophagus, such as fatty foods, alcohol and peppermint. -Avoid foods that affect peristalsis (the muscle movements in your digestive tract), such as coffee, alcohol and acidic liquids.   Return in about 1 week (around 11/09/2021).  Harlon Flor, MD [facility]

## 2021-11-05 ENCOUNTER — Ambulatory Visit: Payer: BC Managed Care – PPO | Admitting: Family Medicine

## 2021-11-08 ENCOUNTER — Encounter: Payer: Self-pay | Admitting: Allergy & Immunology

## 2021-11-08 ENCOUNTER — Ambulatory Visit (INDEPENDENT_AMBULATORY_CARE_PROVIDER_SITE_OTHER): Payer: BC Managed Care – PPO | Admitting: Allergy & Immunology

## 2021-11-08 VITALS — BP 126/80 | HR 99 | Temp 99.0°F | Resp 18 | Ht 59.0 in | Wt 170.0 lb

## 2021-11-08 DIAGNOSIS — J3089 Other allergic rhinitis: Secondary | ICD-10-CM | POA: Diagnosis not present

## 2021-11-08 DIAGNOSIS — J302 Other seasonal allergic rhinitis: Secondary | ICD-10-CM

## 2021-11-08 DIAGNOSIS — K219 Gastro-esophageal reflux disease without esophagitis: Secondary | ICD-10-CM

## 2021-11-08 MED ORDER — CETIRIZINE HCL 10 MG PO TABS: 10.0000 mg | ORAL_TABLET | Freq: Every day | ORAL | 5 refills | Status: DC

## 2021-11-08 MED ORDER — RYALTRIS 665-25 MCG/ACT NA SUSP: NASAL | 5 refills | Status: DC

## 2021-11-08 MED ORDER — FAMOTIDINE 20 MG PO TABS: 20.0000 mg | ORAL_TABLET | Freq: Two times a day (BID) | ORAL | 5 refills | Status: DC

## 2021-11-08 NOTE — Progress Notes (Signed)
FOLLOW UP  Date of Service/Encounter:  11/08/21   Assessment:   Seasonal and perennial allergic rhinitis (grasses, ragweed, weeds, trees, indoor molds, outdoor molds, dust mites, cat, and cockroach)  Gastroesophageal reflux disease  Plan/Recommendations:   1. Seasonal and perennial allergic rhinitis - Testing today showed: grasses, ragweed, weeds, trees, indoor molds, outdoor molds, dust mites, cat, and cockroach. - Copy of test results provided.  - Avoidance measures provided. - Continue with: Zyrtec (cetirizine) 10mg  tablet once daily - Start taking: Ryaltris (olopatadine/mometasone) two sprays per nostril 1-2 times daily as needed - You can use an extra dose of the antihistamine, if needed, for breakthrough symptoms.  - Consider nasal saline rinses 1-2 times daily to remove allergens from the nasal cavities as well as help with mucous clearance (this is especially helpful to do before the nasal sprays are given) - Consider allergy shots as a means of long-term control. - Allergy shots "re-train" and "reset" the immune system to ignore environmental allergens and decrease the resulting immune response to those allergens (sneezing, itchy watery eyes, runny nose, nasal congestion, etc).    - Allergy shots improve symptoms in 75-85% of patients.  - We can discuss more at the next appointment if the medications are not working for you.  2. Gastroesophageal reflux disease - Start the famotidine 20mg  TWICE DAILY (we will send in a script) - Continue with the behavior changes as recommended by Dr. 04-17-1995. - We can do a stronger medication in the future if needed.  3. Return in about 3 months (around 02/08/2022).     Subjective:   Rhonda Fisher is a 41 y.o. 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  05[birth year]01/2013 is a 41 y.o. female presenting for skin testing. She was last seen in October 2023 by Dr. 24. At that time, she was re-establishing care with the office. She had been on antihistamines, so testing was rescheduled so this could be updated. She was started on PRN Pepcid 20mg  BID. Dr. November 2023 held her ASA.   In the interim, she has held her antihistamines. She has not picked up the Pepcid at the pharmacy yet. She is ready to get her testing over with and get back on some medication. She is here with her children's appointments today and wants to know whether she can just do the testing today instead.   Otherwise, there have been no changes to her past medical history, surgical history, family history, or social history.    Review of Systems  Constitutional: Negative.  Negative for fever, malaise/fatigue and weight loss.  HENT:  Positive for congestion and sinus pain. Negative for ear discharge and ear pain.   Eyes:  Negative for pain, discharge and redness.  Respiratory:  Negative for cough, sputum production, shortness of breath and wheezing.   Cardiovascular: Negative.  Negative for chest pain and palpitations.  Gastrointestinal:  Negative for abdominal pain, heartburn, nausea and vomiting.  Skin:  Negative for itching and rash.  Neurological:  Negative  for dizziness and headaches.  Endo/Heme/Allergies:  Negative for environmental allergies. Does not bruise/bleed easily.       Objective:   Blood pressure 126/80, pulse 99, temperature 99 F (37.2 C), resp. rate 18, height 4\' 11"  (1.499 m), weight 170 lb (77.1 kg), SpO2 100 %. Body mass index is 34.34 kg/m.    Physical exam deferred since  this was a skin testing appointment only   Diagnostic studies:   Test Information  Time Antigen Placed 1535      Allergen Manufacturer Greer      Location Back      Number of Test 59      Routine  1. Control-Buffer 50% Glycerol Negative      2. Control-Histamine 1 mg/ml 2+      3. Albumin saline Negative      Grasses  4. Bahia 2+      5. Negative      6. Johnson Negative      7. Kentucky Blue Negative      8. Meadow Fescue 2+      9. Perennial Rye Negative      10. Sweet Vernal Negative      11. Timothy Negative      Weeds  12. Cocklebur Negative      13. Burweed Marshelder 3+      14. Ragweed, short 2+      15. Ragweed, Giant 2+      16. Plantain,  English 2+      17. Lamb's Quarters 2+      18. Sheep Sorrell 2+      19. Rough Pigweed 2+      20. Marsh Elder, Rough 2+      21. Mugwort, Common 2+      Trees  22. Ash mix 2+      23. Birch mix 2+      24. Beech American 2+      25. Box, Elder 2+      26. Cedar, red 2+      27. Cottonwood, Eastern 2+      28. Elm mix 2+      29. Hickory 2+      30. Maple mix 2+      31. Oak, [place] mix 2+      32. Pecan Pollen 2+      33. Pine mix 2+      34. Sycamore Eastern Negative      35. Walnut, Black Pollen Negative      Major Molds Mix (seasonal) 1  36. Alternaria alternata 3+      37. Cladosporium Herbarum 3+      Major Molds Mix (perennial ) 2  38. Aspergillus mix 3+      39. Penicillium mix 3+      Minor Mold Mix (seasonal) 3  40. Bipolaris sorokiniana (Helminthosporium) 2+      41. Drechslera spicifera (Curvularia) Negative      42. Mucor plumbeus Negative      Minor Molds Mix (perennial ) 4  43. Fusarium moniliforme 3+      44. Aureobasidium pullulans (pullulara) Negative      45. Rhizopus oryzae 2+      Other Molds  46. Botrytis cinera 2+      47. Epicoccum nigrum 2+      48. Phoma betae 3+      49. Candida Albicans Negative      50. Trichophyton mentagrophytes Negative  Inhalants  51. Mite,  D Farinae  5,000 AU/ml 3+      52. Mite, D Pteronyssinus  5,000 AU/ml 2+      53. Cat Hair 10,000 BAU/ml 2+      54.  Dog Epithelia Negative      55. Mixed Feathers Negative      56. Horse Epithelia Negative      57. Cockroach, German 2+      58. Mouse Negative      59. Tobacco Leaf Negative          Salvatore Marvel, MD  [facility]

## 2021-11-08 NOTE — Patient Instructions (Addendum)
1. Seasonal and perennial allergic rhinitis - Testing today showed: grasses, ragweed, weeds, trees, indoor molds, outdoor molds, dust mites, cat, and cockroach. - Copy of test results provided.  - Avoidance measures provided. - Continue with: Zyrtec (cetirizine) 10mg  tablet once daily - Start taking: Ryaltris (olopatadine/mometasone) two sprays per nostril 1-2 times daily as needed - You can use an extra dose of the antihistamine, if needed, for breakthrough symptoms.  - Consider nasal saline rinses 1-2 times daily to remove allergens from the nasal cavities as well as help with mucous clearance (this is especially helpful to do before the nasal sprays are given) - Consider allergy shots as a means of long-term control. - Allergy shots "re-train" and "reset" the immune system to ignore environmental allergens and decrease the resulting immune response to those allergens (sneezing, itchy watery eyes, runny nose, nasal congestion, etc).    - Allergy shots improve symptoms in 75-85% of patients.  - We can discuss more at the next appointment if the medications are not working for you.  2. Gastroesophageal reflux disease - Start the famotidine 20mg  TWICE DAILY (we will send in a script) - Continue with the behavior changes as recommended by Dr. 04-17-1995. - We can do a stronger medication in the future if needed.  3. Return in about 3 months (around 02/08/2022).    Please inform Allena Katz of any Emergency Department visits, hospitalizations, or changes in symptoms. Call 04/09/2022 before going to the ED for breathing or allergy symptoms since we might be able to fit you in for a sick visit. Feel free to contact us anytime with any questions, problems, or concerns.  It was a pleasure to see you guys again today!  Websites that have reliable patient information: 1. American Academy of Asthma, Allergy, and Immunology: www.aaaai.org 2. Food Allergy Research and Education (FARE): foodallergy.org 3. Mothers of  Asthmatics: http://www.asthmacommunitynetwork.org 4. American College of Allergy, Asthma, and Immunology: www.acaai.org   COVID-19 Vaccine Information can be found at: [place] For questions related to vaccine distribution or appointments, please email [email or web address] or call [phone].   We realize that you might be concerned about having an allergic reaction to the COVID19 vaccines. To help with that concern, WE ARE OFFERING THE COVID19 VACCINES IN OUR OFFICE! Ask the front desk for dates!     "Like" [email or web address] on Facebook and Instagram for our latest updates!      A healthy democracy works best when [phone] participate! Make sure you are registered to vote! If you have moved or changed any of your contact information, you will need to get this updated before voting!  In some cases, you MAY be able to register to vote online: [place]        Airborne Adult Perc - 11/08/21 1535     Time Antigen Placed 1535    Allergen Manufacturer [email or web address]    Location Back    Number of Test 59    1. Control-Buffer 50% Glycerol Negative    2. Control-Histamine 1 mg/ml 2+    3. Albumin saline Negative    4. Bahia 2+    5. 01/08/22 Negative    6. Johnson Negative    7. Kentucky Blue Negative    8. Meadow Fescue 2+    9. Perennial Rye Negative    10. Sweet Vernal Negative    11. Timothy Negative    12. Cocklebur Negative    13. Burweed Marshelder 3+    14. Ragweed, short 2+    15.  Ragweed, Giant 2+    16. Plantain,  English 2+    17. Lamb's Quarters 2+    18. Sheep Sorrell 2+    19. Rough Pigweed 2+    20. Marsh Elder, Rough 2+    21. Mugwort, Common 2+    22. Ash mix 2+    23. Birch mix 2+    24. Beech American 2+    25. Box, Elder 2+    26. Cedar, red 2+    27. Cottonwood, Eastern 2+    28. Elm mix 2+    29. Hickory 2+    30. Maple mix 2+    31. Oak, Guinea[place] mix 2+     32. Pecan Pollen 2+    33. Pine mix 2+    34. Sycamore Eastern Negative    35. Walnut, Black Pollen Negative    36. Alternaria alternata 3+    37. Cladosporium Herbarum 3+    38. Aspergillus mix 3+    39. Penicillium mix 3+    40. Bipolaris sorokiniana (Helminthosporium) 2+    41. Drechslera spicifera (Curvularia) Negative    42. Mucor plumbeus Negative    43. Fusarium moniliforme 3+    44. Aureobasidium pullulans (pullulara) Negative    45. Rhizopus oryzae 2+    46. Botrytis cinera 2+    47. Epicoccum nigrum 2+    48. Phoma betae 3+    49. Candida Albicans Negative    50. Trichophyton mentagrophytes Negative    51. Mite, D Farinae  5,000 AU/ml 3+    52. Mite, D Pteronyssinus  5,000 AU/ml 2+    53. Cat Hair 10,000 BAU/ml 2+    54.  Dog Epithelia Negative    55. Mixed Feathers Negative    56. Horse Epithelia Negative    57. Cockroach, German 2+    58. Mouse Negative    59. Tobacco Leaf Negative               Reducing Pollen Exposure  The American Academy of Allergy, Asthma and Immunology suggests the following steps to reduce your exposure to pollen during allergy seasons.    Do not hang sheets or clothing out to dry; pollen may collect on these items. Do not mow lawns or spend time around freshly cut grass; mowing stirs up pollen. Keep windows closed at night.  Keep car windows closed while driving. Minimize morning activities outdoors, a time when pollen counts are usually at their highest. Stay indoors as much as possible when pollen counts or humidity is high and on windy days when pollen tends to remain in the air longer. Use air conditioning when possible.  Many air conditioners have filters that trap the pollen spores. Use a HEPA room air filter to remove pollen form the indoor air you breathe.   Control of Mold Allergen   Mold and fungi can grow on a variety of surfaces provided certain temperature and moisture conditions exist.  Outdoor molds grow on  plants, decaying vegetation and soil.  The major outdoor mold, Alternaria and Cladosporium, are found in very high numbers during hot and dry conditions.  Generally, a late Summer - Fall peak is seen for common outdoor fungal spores.  Rain will temporarily lower outdoor mold spore count, but counts rise rapidly when the rainy period ends.  The most important indoor molds are Aspergillus and Penicillium.  Dark, humid and poorly ventilated basements are ideal sites for mold growth.  The next most common sites  of mold growth are the bathroom and the kitchen.  Outdoor (Seasonal) Mold Control   Use air conditioning and keep windows closed Avoid exposure to decaying vegetation. Avoid leaf raking. Avoid grain handling. Consider wearing a face mask if working in moldy areas.    Indoor (Perennial) Mold Control    Maintain humidity below 50%. Clean washable surfaces with 5% bleach solution. Remove sources e.g. contaminated carpets.     Control of Dust Mite Allergen    Dust mites play a major role in allergic asthma and rhinitis.  They occur in environments with high humidity wherever human skin is found.  Dust mites absorb humidity from the atmosphere (ie, they do not drink) and feed on organic matter (including shed human and animal skin).  Dust mites are a microscopic type of insect that you cannot see with the naked eye.  High levels of dust mites have been detected from mattresses, pillows, carpets, upholstered furniture, bed covers, clothes, soft toys and any woven material.  The principal allergen of the dust mite is found in its feces.  A gram of dust may contain 1,000 mites and 250,000 fecal particles.  Mite antigen is easily measured in the air during house cleaning activities.  Dust mites do not bite and do not cause harm to humans, other than by triggering allergies/asthma.    Ways to decrease your exposure to dust mites in your home:  Encase mattresses, box springs and pillows with a  mite-impermeable barrier or cover   Wash sheets, blankets and drapes weekly in hot water (130 F) with detergent and dry them in a dryer on the hot setting.  Have the room cleaned frequently with a vacuum cleaner and a damp dust-mop.  For carpeting or rugs, vacuuming with a vacuum cleaner equipped with a high-efficiency particulate air (HEPA) filter.  The dust mite allergic individual should not be in a room which is being cleaned and should wait 1 hour after cleaning before going into the room. Do not sleep on upholstered furniture (eg, couches).   If possible removing carpeting, upholstered furniture and drapery from the home is ideal.  Horizontal blinds should be eliminated in the rooms where the person spends the most time (bedroom, study, television room).  Washable vinyl, roller-type shades are optimal. Remove all non-washable stuffed toys from the bedroom.  Wash stuffed toys weekly like sheets and blankets above.   Reduce indoor humidity to less than 50%.  Inexpensive humidity monitors can be purchased at most hardware stores.  Do not use a humidifier as can make the problem worse and are not recommended.   Control of Cockroach Allergen  Cockroach allergen has been identified as an important cause of acute attacks of asthma, especially in urban settings.  There are fifty-five species of cockroach that exist in the [place], however only three, the [place], [place] species produce allergen that can affect patients with Asthma.  Allergens can be obtained from fecal particles, egg casings and secretions from cockroaches.    Remove food sources. Reduce access to water. Seal access and entry points. Spray runways with 0.5-1% Diazinon or Chlorpyrifos Blow boric acid power under stoves and refrigerator. Place bait stations (hydramethylnon) at feeding sites.   Allergy Shots   Allergies are the result of a chain reaction that starts in the immune system. Your immune system  controls how your body defends itself. For instance, if you have an allergy to pollen, your immune system identifies pollen as an invader or allergen.  Your immune system overreacts by producing antibodies called Immunoglobulin E (IgE). These antibodies travel to cells that release chemicals, causing an allergic reaction.  The concept behind allergy immunotherapy, whether it is received in the form of shots or tablets, is that the immune system can be desensitized to specific allergens that trigger allergy symptoms. Although it requires time and patience, the payback can be long-term relief.  How Do Allergy Shots Work?  Allergy shots work much like a vaccine. Your body responds to injected amounts of a particular allergen given in increasing doses, eventually developing a resistance and tolerance to it. Allergy shots can lead to decreased, minimal or no allergy symptoms.  There generally are two phases: build-up and maintenance. Build-up often ranges from three to six months and involves receiving injections with increasing amounts of the allergens. The shots are typically given once or twice a week, though more rapid build-up schedules are sometimes used.  The maintenance phase begins when the most effective dose is reached. This dose is different for each person, depending on how allergic you are and your response to the build-up injections. Once the maintenance dose is reached, there are longer periods between injections, typically two to four weeks.  Occasionally doctors give cortisone-type shots that can temporarily reduce allergy symptoms. These types of shots are different and should not be confused with allergy immunotherapy shots.  Who Can Be Treated with Allergy Shots?  Allergy shots may be a good treatment approach for people with allergic rhinitis (hay fever), allergic asthma, conjunctivitis (eye allergy) or stinging insect allergy.   Before deciding to begin allergy shots, you should  consider:   The length of allergy season and the severity of your symptoms  Whether medications and/or changes to your environment can control your symptoms  Your desire to avoid long-term medication use  Time: allergy immunotherapy requires a major time commitment  Cost: may vary depending on your insurance coverage  Allergy shots for children age 73 and older are effective and often well tolerated. They might prevent the onset of new allergen sensitivities or the progression to asthma.  Allergy shots are not started on patients who are pregnant but can be continued on patients who become pregnant while receiving them. In some patients with other medical conditions or who take certain common medications, allergy shots may be of risk. It is important to mention other medications you talk to your allergist.   When Will I Feel Better?  Some may experience decreased allergy symptoms during the build-up phase. For others, it may take as long as 12 months on the maintenance dose. If there is no improvement after a year of maintenance, your allergist will discuss other treatment options with you.  If you aren't responding to allergy shots, it may be because there is not enough dose of the allergen in your vaccine or there are missing allergens that were not identified during your allergy testing. Other reasons could be that there are high levels of the allergen in your environment or major exposure to non-allergic triggers like tobacco smoke.  What Is the Length of Treatment?  Once the maintenance dose is reached, allergy shots are generally continued for three to five years. The decision to stop should be discussed with your allergist at that time. Some people may experience a permanent reduction of allergy symptoms. Others may relapse and a longer course of allergy shots can be considered.  What Are the Possible Reactions?  The two types of adverse reactions that can  occur with allergy shots  are local and systemic. Common local reactions include very mild redness and swelling at the injection site, which can happen immediately or several hours after. A systemic reaction, which is less common, affects the entire body or a particular body system. They are usually mild and typically respond quickly to medications. Signs include increased allergy symptoms such as sneezing, a stuffy nose or hives.  Rarely, a serious systemic reaction called anaphylaxis can develop. Symptoms include swelling in the throat, wheezing, a feeling of tightness in the chest, nausea or dizziness. Most serious systemic reactions develop within 30 minutes of allergy shots. This is why it is strongly recommended you wait in your doctor's office for 30 minutes after your injections. Your allergist is trained to watch for reactions, and his or her staff is trained and equipped with the proper medications to identify and treat them.  Who Should Administer Allergy Shots?  The preferred location for receiving shots is your prescribing allergist's office. Injections can sometimes be given at another facility where the physician and staff are trained to recognize and treat reactions, and have received instructions by your prescribing allergist.

## 2021-11-09 ENCOUNTER — Telehealth: Payer: Self-pay | Admitting: Allergy & Immunology

## 2021-11-09 ENCOUNTER — Ambulatory Visit: Payer: BC Managed Care – PPO | Admitting: Internal Medicine

## 2021-11-09 MED ORDER — FAMOTIDINE 40 MG PO TABS: 40.0000 mg | ORAL_TABLET | Freq: Every day | ORAL | 5 refills | Status: DC

## 2021-11-09 MED ORDER — AZELASTINE-FLUTICASONE 137-50 MCG/ACT NA SUSP
2.0000 | NASAL | 5 refills | Status: DC
Start: 2021-11-09 — End: 2021-11-12

## 2021-11-09 NOTE — Telephone Encounter (Signed)
Patient states she cannot afford the reflux medication that was sent in for her. Patient is wanting a cheaper alternative sent in.   Please advise  Best contact number: [phone]

## 2021-11-09 NOTE — Telephone Encounter (Signed)
I spoke to the patient and clarified that this was the nose spray. We instead sent in [place] instead.   I also sent the famotidine to [facility] on [address].   Salvatore Marvel, MD [facility]

## 2021-11-11 ENCOUNTER — Encounter: Payer: Self-pay | Admitting: Allergy & Immunology

## 2021-11-11 DIAGNOSIS — K219 Gastro-esophageal reflux disease without esophagitis: Secondary | ICD-10-CM | POA: Insufficient documentation

## 2021-11-12 ENCOUNTER — Other Ambulatory Visit: Payer: Self-pay

## 2021-11-12 MED ORDER — AZELASTINE-FLUTICASONE 137-50 MCG/ACT NA SUSP: 2.0000 | Freq: Every day | NASAL | 5 refills | Status: DC

## 2022-01-08 ENCOUNTER — Ambulatory Visit: Payer: BC Managed Care – PPO | Admitting: Internal Medicine

## 2022-01-15 ENCOUNTER — Other Ambulatory Visit: Payer: Self-pay

## 2022-01-15 ENCOUNTER — Ambulatory Visit (INDEPENDENT_AMBULATORY_CARE_PROVIDER_SITE_OTHER): Payer: BC Managed Care – PPO | Admitting: Internal Medicine

## 2022-01-15 ENCOUNTER — Encounter: Payer: Self-pay | Admitting: Internal Medicine

## 2022-01-15 VITALS — BP 120/70 | HR 100 | Temp 98.6°F | Resp 20 | Ht 59.0 in | Wt 176.0 lb

## 2022-01-15 DIAGNOSIS — J302 Other seasonal allergic rhinitis: Secondary | ICD-10-CM

## 2022-01-15 DIAGNOSIS — H1013 Acute atopic conjunctivitis, bilateral: Secondary | ICD-10-CM | POA: Diagnosis not present

## 2022-01-15 DIAGNOSIS — K219 Gastro-esophageal reflux disease without esophagitis: Secondary | ICD-10-CM

## 2022-01-15 DIAGNOSIS — J3089 Other allergic rhinitis: Secondary | ICD-10-CM | POA: Diagnosis not present

## 2022-01-15 MED ORDER — OLOPATADINE HCL 0.2 % OP SOLN
1.0000 [drp] | Freq: Every day | OPHTHALMIC | 5 refills | Status: DC | PRN
Start: 2022-01-15 — End: 2022-12-03

## 2022-01-15 MED ORDER — FAMOTIDINE 20 MG PO TABS: 20.0000 mg | ORAL_TABLET | Freq: Every day | ORAL | 5 refills | Status: DC | PRN

## 2022-01-15 MED ORDER — AZELASTINE-FLUTICASONE 137-50 MCG/ACT NA SUSP: 2.0000 | Freq: Every day | NASAL | 5 refills | Status: DC

## 2022-01-15 MED ORDER — CETIRIZINE HCL 10 MG PO TABS: 10.0000 mg | ORAL_TABLET | Freq: Every day | ORAL | 5 refills | Status: DC

## 2022-01-15 NOTE — Progress Notes (Signed)
FOLLOW UP Date of Service/Encounter:  01/15/22   Subjective:  Rhonda Fisher (DOB: 05-23-1980) is a 41 y.o. female who [facility] for follow up for allergic rhinitis, allergic conjunctivitis and GERD.  History obtained from: chart review and patient. Last visit was with Dr. Dellis Anes 11/08/2021 where she underwent skin testing and was positive to grasses, weeds, molds, dust mite, cat, cockroach.  Started on Dymista, Zyrtec, rinses and Pepcid for GERD.  Since last visit, she reports doing better.  She does still sometimes have congestion, rhinorrhea and sneezing but overall is better.  She is taking Zyrtec daily and tries to use Dymista regularly but she does forget.  She rarely has itchy watery eyes.   In terms of her reflux, she is taking Pepcid 20mg  daily and has noticed an improvement.  She does not have burning and reflux when she eats certain foods anymore.    Past Medical History: Past Medical History:  Diagnosis Date   Abnormal Pap smear    colpo wnl   Hypertension    currently no meds   Hypertension    Seasonal allergies     Objective:  BP 120/70   Pulse 100   Temp 98.6 F (37 C)   Resp 20   Ht 4\' 11"  (1.499 m)   Wt 176 lb (79.8 kg)   SpO2 98%   BMI 35.55 kg/m  Body mass index is 35.55 kg/m. Physical Exam: GEN: alert, well developed HEENT: clear conjunctiva, TM grey and translucent, nose with mild inferior turbinate hypertrophy, pink nasal mucosa, slight clear rhinorrhea, + cobblestoning HEART: regular rate and rhythm, no murmur LUNGS: clear to auscultation bilaterally, no coughing, unlabored respiration SKIN: no rashes or lesions  Data Reviewed:  SPT: 10/2021 positive grasses, weeds, molds, dust mite, cat, cockroach  Assessment/Plan  Seasonal and perennial allergic rhinitis Allergic Conjunctivitis - Positive skin test 10/2021: grasses, ragweed, weeds, trees, indoor molds, outdoor molds, dust mites, cat, and  cockroach. - Avoidance measures discussed. - Use nasal saline rinses before nose sprays such as with Neilmed Sinus Rinse.  Use distilled water.   - Use Dymista 2 sprays daily or 1 spray twice daily.   If this is expensive then let 11/2021 know and we will call it in as two different sprays with Flonase 2 SEN daily and Azelastine 1 SEN BID.  - Use Zyrtec 10 mg daily.  - For eyes, use Olopatadine or Ketotifen 1 eye drop daily as needed for itchy, watery eyes.  Available over the counter, if not covered by insurance.  - Consider allergy shots as long term control of your symptoms by teaching your immune system to be more tolerant of your allergy triggers  Gastroesophageal reflux disease -Use famotidine 20mg  daily as needed for reflux. -Avoid lying down for at least two hours after a meal or after drinking acidic beverages, like soda, or other caffeinated beverages. This can help to prevent stomach contents from flowing back into the esophagus. -Keep your head elevated while you sleep. Using an extra pillow or two can also help to prevent reflux. -Eat smaller and more frequent meals each day instead of a few large meals. This promotes digestion and can aid in preventing heartburn. -Wear loose-fitting clothes to ease pressure on the stomach, which can worsen heartburn and reflux. -Reduce excess weight around the midsection. This can ease pressure on the stomach. Such pressure can force some stomach contents back up the esophagus.   Return in  about 4 months (around 05/17/2022). Harlon Flor, MD  [facility]

## 2022-01-15 NOTE — Patient Instructions (Addendum)
Seasonal and perennial allergic rhinitis Allergic Conjunctivitis - Positive skin test 10/2021: grasses, ragweed, weeds, trees, indoor molds, outdoor molds, dust mites, cat, and cockroach. - Avoidance measures discussed. - Use nasal saline rinses before nose sprays such as with Neilmed Sinus Rinse.  Use distilled water.   - Use Dymista 2 sprays daily or 1 spray twice daily.   If this is expensive then let us know and we will call it in as two different sprays.  - Use Zyrtec 10 mg daily.  - For eyes, use Olopatadine or Ketotifen 1 eye drop daily as needed for itchy, watery eyes.  Available over the counter, if not covered by insurance.  - Consider allergy shots as long term control of your symptoms by teaching your immune system to be more tolerant of your allergy triggers  Gastroesophageal reflux disease - Continue famotidine 20mg  daily as needed for reflux. -Avoid lying down for at least two hours after a meal or after drinking acidic beverages, like soda, or other caffeinated beverages. This can help to prevent stomach contents from flowing back into the esophagus. -Keep your head elevated while you sleep. Using an extra pillow or two can also help to prevent reflux. -Eat smaller and more frequent meals each day instead of a few large meals. This promotes digestion and can aid in preventing heartburn. -Wear loose-fitting clothes to ease pressure on the stomach, which can worsen heartburn and reflux. -Reduce excess weight around the midsection. This can ease pressure on the stomach. Such pressure can force some stomach contents back up the esophagus.    3. Return in about 4 months (around 05/17/2022).

## 2022-03-18 ENCOUNTER — Other Ambulatory Visit: Payer: Self-pay | Admitting: Internal Medicine

## 2022-03-18 DIAGNOSIS — Z1231 Encounter for screening mammogram for malignant neoplasm of breast: Secondary | ICD-10-CM

## 2022-05-06 ENCOUNTER — Ambulatory Visit
Admission: RE | Admit: 2022-05-06 | Discharge: 2022-05-06 | Disposition: A | Discharge status: Home / Self Care | Payer: BC Managed Care – PPO | Source: Ambulatory Visit | Attending: Internal Medicine | Admitting: Internal Medicine

## 2022-05-06 DIAGNOSIS — Z1231 Encounter for screening mammogram for malignant neoplasm of breast: Secondary | ICD-10-CM

## 2022-05-14 ENCOUNTER — Ambulatory Visit: Payer: BC Managed Care – PPO | Admitting: Internal Medicine

## 2022-05-14 DIAGNOSIS — J309 Allergic rhinitis, unspecified: Secondary | ICD-10-CM

## 2022-11-21 ENCOUNTER — Telehealth: Payer: Self-pay

## 2022-11-21 NOTE — Telephone Encounter (Signed)
Mom advised the following:  Last seen: 12/19/2 return in 4 months.  Scheduled 12/03/22 @ 11 am w/Chrissie.  Mom verbalized understanding to all - will make a payment at appt - no further questions.

## 2022-12-02 NOTE — Patient Instructions (Incomplete)
Seasonal and perennial allergic rhinitis Allergic Conjunctivitis - Positive skin test 10/2021: grasses, ragweed, weeds, trees, indoor molds, outdoor molds, dust mites, cat, and cockroach. - Avoidance measures discussed. - Use nasal saline rinses before nose sprays such as with Neilmed Sinus Rinse.  Use distilled water.   - Use Dymista 2 sprays daily or 1 spray twice daily.   If this is expensive then let us know and we will call it in as two different sprays.  - Use Zyrtec 10 mg daily.  - For eyes, use Olopatadine or Ketotifen 1 eye drop daily as needed for itchy, watery eyes.  Available over the counter, if not covered by insurance.  - Consider allergy shots as long term control of your symptoms by teaching your immune system to be more tolerant of your allergy triggers  Gastroesophageal reflux disease - Continue famotidine 20mg  daily as needed for reflux. -Avoid lying down for at least two hours after a meal or after drinking acidic beverages, like soda, or other caffeinated beverages. This can help to prevent stomach contents from flowing back into the esophagus. -Keep your head elevated while you sleep. Using an extra pillow or two can also help to prevent reflux. -Eat smaller and more frequent meals each day instead of a few large meals. This promotes digestion and can aid in preventing heartburn. -Wear loose-fitting clothes to ease pressure on the stomach, which can worsen heartburn and reflux. -Reduce excess weight around the midsection. This can ease pressure on the stomach. Such pressure can force some stomach contents back up the esophagus.    3. Schedule a follow up appointment in months or sooner if needed

## 2022-12-03 ENCOUNTER — Ambulatory Visit (INDEPENDENT_AMBULATORY_CARE_PROVIDER_SITE_OTHER): Payer: BC Managed Care – PPO | Admitting: Family

## 2022-12-03 ENCOUNTER — Encounter: Payer: Self-pay | Admitting: Family

## 2022-12-03 ENCOUNTER — Other Ambulatory Visit: Payer: Self-pay

## 2022-12-03 VITALS — BP 120/84 | HR 81 | Resp 18 | Ht 59.45 in | Wt 176.0 lb

## 2022-12-03 DIAGNOSIS — J302 Other seasonal allergic rhinitis: Secondary | ICD-10-CM | POA: Diagnosis not present

## 2022-12-03 DIAGNOSIS — H1013 Acute atopic conjunctivitis, bilateral: Secondary | ICD-10-CM

## 2022-12-03 DIAGNOSIS — K219 Gastro-esophageal reflux disease without esophagitis: Secondary | ICD-10-CM

## 2022-12-03 DIAGNOSIS — J3089 Other allergic rhinitis: Secondary | ICD-10-CM

## 2022-12-03 MED ORDER — MOMETASONE FUROATE 50 MCG/ACT NA SUSP: 2.0000 | Freq: Every day | NASAL | 12 refills | Status: AC

## 2022-12-03 MED ORDER — CETIRIZINE HCL 10 MG PO TABS: 10.0000 mg | ORAL_TABLET | Freq: Every day | ORAL | 5 refills | Status: AC

## 2022-12-03 MED ORDER — FAMOTIDINE 20 MG PO TABS: 20.0000 mg | ORAL_TABLET | Freq: Every day | ORAL | 5 refills | Status: AC | PRN

## 2022-12-03 MED ORDER — IPRATROPIUM BROMIDE 0.03 % NA SOLN: 2.0000 | Freq: Two times a day (BID) | NASAL | 12 refills | Status: AC

## 2022-12-03 MED ORDER — OLOPATADINE HCL 0.2 % OP SOLN: 1.0000 [drp] | Freq: Every day | OPHTHALMIC | 5 refills | Status: AC | PRN

## 2022-12-03 NOTE — Progress Notes (Signed)
[address] [place] [address] Dept: [phone]  FOLLOW UP NOTE  Patient ID: Rhonda P Feigenbaum, female    DOB: Apr 10, 1980  Age: 42 y.o. MRN: [id number] Date of Office Visit: 12/03/2022  Assessment  Chief Complaint: Allergic Rhinitis , Stuffing nose, Follow-up, and Medication Refill  HPI Rhonda Fisher is a 42 year old female who presents today for follow-up of seasonal and perennial allergic rhinitis, allergic conjunctivitis, and gastroesophageal reflux disease.  She was last seen on January 15, 2022 by Dr. Allena Katz.  She denies any new diagnosis or surgery since her last office visit.  Seasonal and perennial allergic rhinitis: She reports nasal congestion that occurs daily and denies rhinorrhea and postnasal drip.  She has not been treated for any sinus infections since we last saw her.  She is not currently using any nasal sprays.  She does use Zyrtec 10 mg once a day.  She has tried using saline rinses with a Nettie pot and she felt like it was drowning her. She does report itchy eyes at times.  Gastroesophageal reflux disease is reported as ok.  She is currently taking famotidine 20 mg daily as needed.  She reports that sometimes she will forget to take her famotidine 30 minutes before she eats, but she will take it after she eats.  She avoids red sauces and foods that bother her.   Drug Allergies:  No Known Allergies  Review of Systems: Negative except as per HPI   Physical Exam: BP 120/84 (BP Location: Left Arm, Patient Position: Sitting, Cuff Size: Normal)   Pulse 81   Resp 18   Ht 4' 11.45" (1.51 m)   Wt 176 lb (79.8 kg)   SpO2 99%   BMI 35.01 kg/m    Physical Exam Constitutional:      Appearance: Normal appearance.  HENT:     Head: Normocephalic and atraumatic.     Comments: Pharynx normal, eyes normal, ears normal, nose: Bilateral lower turbinates mildly edematous and erythematous with no drainage noted    Right Ear: Tympanic membrane, ear canal and external ear  normal.     Left Ear: Tympanic membrane, ear canal and external ear normal.     Mouth/Throat:     Mouth: Mucous membranes are moist.     Pharynx: Oropharynx is clear.  Eyes:     Conjunctiva/sclera: Conjunctivae normal.  Cardiovascular:     Rate and Rhythm: Regular rhythm.     Heart sounds: Normal heart sounds.  Pulmonary:     Effort: Pulmonary effort is normal.     Breath sounds: Normal breath sounds.     Comments: Lungs clear to auscultation Musculoskeletal:     Cervical back: Neck supple.  Skin:    General: Skin is warm.  Neurological:     Mental Status: She is alert and oriented to person, place, and time.  Psychiatric:        Mood and Affect: Mood normal.        Behavior: Behavior normal.        Thought Content: Thought content normal.        Judgment: Judgment normal.     Diagnostics: None  Assessment and Plan: 1. Seasonal and perennial allergic rhinitis   2. Allergic conjunctivitis of both eyes   3. Gastroesophageal reflux disease, unspecified whether esophagitis present     Meds ordered this encounter  Medications   mometasone (NASONEX) 50 MCG/ACT nasal spray    Sig: Place 2 sprays into the nose daily.  Dispense:  1 each    Refill:  12   ipratropium (ATROVENT) 0.03 % nasal spray    Sig: Place 2 sprays into both nostrils every 12 (twelve) hours.    Dispense:  30 mL    Refill:  12   Olopatadine HCl 0.2 % SOLN    Sig: Apply 1 drop to eye daily as needed (itchy watery eyes).    Dispense:  2.5 mL    Refill:  5   cetirizine (ZYRTEC) 10 MG tablet    Sig: Take 1 tablet (10 mg total) by mouth daily.    Dispense:  30 tablet    Refill:  5   famotidine (PEPCID) 20 MG tablet    Sig: Take 1 tablet (20 mg total) by mouth daily as needed for heartburn or indigestion.    Dispense:  30 tablet    Refill:  5    Patient Instructions  Seasonal and perennial allergic rhinitis Allergic Conjunctivitis - Positive skin test 10/2021: grasses, ragweed, weeds, trees, indoor  molds, outdoor molds, dust mites, cat, and cockroach. - Avoidance measures discussed. - Use nasal saline rinses before nose sprays such as with Neilmed Sinus Rinse.  Use distilled water.   - Start Nasonex 1-2 sprays in each nostril once a day as needed for stuffy nose -Start ipratropium bromide nasal spray using 1 spray in each nostril once to twice a day as needed for runny nose/drainage down throat. Caution as this may make you dry - Use Zyrtec 10 mg daily.  - For eyes, use Olopatadine or Ketotifen 1 eye drop daily as needed for itchy, watery eyes.  Available over the counter, if not covered by insurance.  - Consider allergy shots as long term control of your symptoms by teaching your immune system to be more tolerant of your allergy triggers  Gastroesophageal reflux disease - Continue famotidine 20mg  daily as needed for reflux. -Avoid lying down for at least two hours after a meal or after drinking acidic beverages, like soda, or other caffeinated beverages. This can help to prevent stomach contents from flowing back into the esophagus. -Keep your head elevated while you sleep. Using an extra pillow or two can also help to prevent reflux. -Eat smaller and more frequent meals each day instead of a few large meals. This promotes digestion and can aid in preventing heartburn. -Wear loose-fitting clothes to ease pressure on the stomach, which can worsen heartburn and reflux. -Reduce excess weight around the midsection. This can ease pressure on the stomach. Such pressure can force some stomach contents back up the esophagus.    3. Schedule a follow up appointment in 6-12 months or sooner if needed   Return in about 6 months (around 06/02/2023), or if symptoms worsen or fail to improve.    Thank you for the opportunity to care for this patient.  Please do not hesitate to contact me with questions.  Nehemiah Settle, FNP [facility]

## 2023-03-12 ENCOUNTER — Other Ambulatory Visit: Payer: Self-pay | Admitting: Internal Medicine

## 2023-03-12 DIAGNOSIS — Z1231 Encounter for screening mammogram for malignant neoplasm of breast: Secondary | ICD-10-CM

## 2023-05-07 ENCOUNTER — Ambulatory Visit
Admission: RE | Admit: 2023-05-07 | Discharge: 2023-05-07 | Disposition: A | Discharge status: Home / Self Care | Payer: Self-pay | Source: Ambulatory Visit | Attending: Internal Medicine | Admitting: Internal Medicine

## 2023-05-07 DIAGNOSIS — Z1231 Encounter for screening mammogram for malignant neoplasm of breast: Secondary | ICD-10-CM

## 2024-03-18 ENCOUNTER — Ambulatory Visit: Admitting: Allergy & Immunology
